# Patient Record
Sex: Male | Born: 1935 | Race: Black or African American | Hispanic: No | Marital: Married | Smoking: Former smoker
Health system: Southern US, Community
[De-identification: ages and names within clinical notes are randomized; demographics above are authoritative.]

## PROBLEM LIST (undated history)

## (undated) DIAGNOSIS — F32A Depression, unspecified: Secondary | ICD-10-CM

## (undated) DIAGNOSIS — N400 Enlarged prostate without lower urinary tract symptoms: Secondary | ICD-10-CM

## (undated) DIAGNOSIS — M1990 Unspecified osteoarthritis, unspecified site: Secondary | ICD-10-CM

## (undated) DIAGNOSIS — I209 Angina pectoris, unspecified: Secondary | ICD-10-CM

## (undated) DIAGNOSIS — K219 Gastro-esophageal reflux disease without esophagitis: Secondary | ICD-10-CM

## (undated) DIAGNOSIS — M109 Gout, unspecified: Secondary | ICD-10-CM

## (undated) DIAGNOSIS — I10 Essential (primary) hypertension: Secondary | ICD-10-CM

## (undated) DIAGNOSIS — I739 Peripheral vascular disease, unspecified: Secondary | ICD-10-CM

## (undated) DIAGNOSIS — F329 Major depressive disorder, single episode, unspecified: Secondary | ICD-10-CM

## (undated) DIAGNOSIS — I2510 Atherosclerotic heart disease of native coronary artery without angina pectoris: Secondary | ICD-10-CM

## (undated) DIAGNOSIS — J449 Chronic obstructive pulmonary disease, unspecified: Secondary | ICD-10-CM

## (undated) DIAGNOSIS — C801 Malignant (primary) neoplasm, unspecified: Secondary | ICD-10-CM

## (undated) DIAGNOSIS — E039 Hypothyroidism, unspecified: Secondary | ICD-10-CM

## (undated) HISTORY — PX: CARDIAC CATHETERIZATION: SHX172

## (undated) HISTORY — PX: OTHER SURGICAL HISTORY: SHX169

---

## 1959-05-06 HISTORY — PX: HERNIA REPAIR: SHX51

## 2002-04-03 ENCOUNTER — Ambulatory Visit (HOSPITAL_COMMUNITY): Admission: RE | Admit: 2002-04-03 | Discharge: 2002-04-03 | Payer: Self-pay | Admitting: Internal Medicine

## 2002-05-07 ENCOUNTER — Ambulatory Visit (HOSPITAL_COMMUNITY): Admission: RE | Admit: 2002-05-07 | Discharge: 2002-05-07 | Payer: Self-pay | Admitting: Internal Medicine

## 2003-04-09 ENCOUNTER — Encounter: Payer: Self-pay | Admitting: Rheumatology

## 2003-04-09 ENCOUNTER — Encounter (HOSPITAL_COMMUNITY): Admission: RE | Admit: 2003-04-09 | Discharge: 2003-05-09 | Payer: Self-pay | Admitting: Rheumatology

## 2003-04-24 ENCOUNTER — Encounter: Payer: Self-pay | Admitting: Rheumatology

## 2003-05-21 ENCOUNTER — Encounter (HOSPITAL_COMMUNITY): Admission: RE | Admit: 2003-05-21 | Discharge: 2003-06-20 | Payer: Self-pay | Admitting: Rheumatology

## 2005-05-27 ENCOUNTER — Inpatient Hospital Stay (HOSPITAL_COMMUNITY): Admission: AD | Admit: 2005-05-27 | Discharge: 2005-05-29 | Payer: Self-pay | Admitting: Cardiology

## 2005-05-29 ENCOUNTER — Ambulatory Visit: Payer: Self-pay | Admitting: Cardiology

## 2005-05-29 ENCOUNTER — Encounter: Payer: Self-pay | Admitting: Cardiology

## 2005-06-26 ENCOUNTER — Ambulatory Visit: Payer: Self-pay | Admitting: Cardiology

## 2006-09-06 ENCOUNTER — Ambulatory Visit (HOSPITAL_COMMUNITY): Admission: RE | Admit: 2006-09-06 | Discharge: 2006-09-06 | Payer: Self-pay | Admitting: Ophthalmology

## 2008-05-01 ENCOUNTER — Inpatient Hospital Stay (HOSPITAL_COMMUNITY): Admission: RE | Admit: 2008-05-01 | Discharge: 2008-05-03 | Payer: Self-pay | Admitting: Urology

## 2008-05-01 ENCOUNTER — Encounter (INDEPENDENT_AMBULATORY_CARE_PROVIDER_SITE_OTHER): Payer: Self-pay | Admitting: Urology

## 2010-09-24 ENCOUNTER — Encounter: Payer: Self-pay | Admitting: *Deleted

## 2010-12-16 DIAGNOSIS — R079 Chest pain, unspecified: Secondary | ICD-10-CM

## 2011-01-17 NOTE — Consult Note (Signed)
NAMETHANIEL, COLUCCIO               ACCOUNT NO.:  [id number]   MEDICAL RECORD NO.:  [id number]          PATIENT TYPE:  AMB   LOCATION:  DAY                           FACILITY:  [facility]   PHYSICIAN:  Ky Barban, M.D.DATE OF BIRTH:  12/18/1924   DATE OF CONSULTATION:  DATE OF DISCHARGE:                                 CONSULTATION   CHIEF COMPLAINT:  Urinary retention.   HISTORY:  Mr. Deloria Lair is an 77 year old gentleman who has a  longstanding history of prostatism.  Recently, he was in [facility] with urinary tract infection, and he was in urinary retention  and found to have BPH with bladder neck obstruction, so I advised him to  undergo TUR prostate for which he is coming as outpatient in the morning  and is undergoing TUR prostate.   PAST HISTORY:  Otherwise unremarkable.   PHYSICAL EXAMINATION:  GENERAL:  Well-nourished, well-developed male not  in acute distress, fully conscious, alert, oriented.  CENTRAL NERVOUS SYSTEM:  Negative.  HEAD, NECK, EYES, ENT:  Negative.  CHEST:  Symmetrical.  HEART:  Regular sinus rhythm with no murmur.  ABDOMEN:  Soft, flat.  Liver, spleen, and kidneys are not palpable.  BACK:  No CVA tenderness.  PELVIC:  External genitalia is uncircumcised.  Meatus adequate.  Testicles are normal.  RECTAL:  Sphincter tone is normal.  No rectal mass.  Prostate 2+,  smooth, and firm.   IMPRESSION:  Benign prostatic hyperplasia with bladder neck obstruction.   PLAN:  Transurethral resection of prostate under anesthesia, then admit  him in the hospital.  I have discussed the procedure in detail with the  son, and the son want me to go ahead and proceed.      Ky Barban, M.D.  Electronically Signed     MIJ/MEDQ  D:  04/30/2008  T:  05/01/2008  Job:  [id number]

## 2011-01-17 NOTE — Op Note (Signed)
John Schmidt, John Schmidt               ACCOUNT NO.:  [id number]   MEDICAL RECORD NO.:  [id number]          PATIENT TYPE:  AMB   LOCATION:  DAY                           FACILITY:  [facility]   PHYSICIAN:  Ky Barban, M.D.DATE OF BIRTH:  12/18/1924   DATE OF PROCEDURE:  DATE OF DISCHARGE:                               OPERATIVE REPORT   PREOPERATIVE DIAGNOSES:  Acute urinary retention, benign prostatic  hypertrophy.   POSTOPERATIVE DIAGNOSES:  Acute urinary retention, benign prostatic  hypertrophy.   PROCEDURE:  Transurethral resection of prostate.   ANESTHESIA:  Spinal.   PROCEDURE:  The patient under spinal anesthesia in lithotomy position,  after usual prep and drape, #28 Iglesias resectoscope was introduced  into the bladder.  The prostatic urethra was inspected, procedure to the  resection of the prostate.  The bladder neck was circumferentially  dissected down to the circular fibers.  Now, the resectoscope was pulled  back at the level of the verumontanum, rotated to 11 o'clock position.  The right lobe was resected between 11 and 7 o'clock position.  Similarly, the left lobe was resected between 1 and 5 o'clock position.  Most of the adenoma is out.  There is some tissue in the anterior and  the posterior midline, which was resected very carefully not to injure  the verumontanum or the sphincter.  Prostatic urethra was open.  Chips  were evacuated.  Bleeders were coagulated.  At this point, resectoscope  was removed and #22 three-way Foley catheter left in for drainage.  CBI  started.  CVA is clear.  The patient left the operating room in  satisfactory condition.      Ky Barban, M.D.  Electronically Signed     MIJ/MEDQ  D:  05/01/2008  T:  05/02/2008  Job:  [id number]

## 2011-01-20 NOTE — Consult Note (Signed)
NAMEJEREN, John Schmidt NO.:  [id number]   MEDICAL RECORD NO.:  [id number]                   PATIENT TYPE:   LOCATION:                                       FACILITY:   PHYSICIAN:  Aundra Dubin, M.D.            DATE OF BIRTH:   DATE OF CONSULTATION:  05/21/2003  DATE OF DISCHARGE:                                   CONSULTATION   CHIEF COMPLAINT:  Polyarthritis.   HISTORY OF PRESENT ILLNESS:  John Schmidt labs returned showing a high  rheumatoid factor of 2360.  Other labs showed a hemoglobin of 8.1, MCV 70,  platelets 441.  I tried to add on iron studies, but this was not possible.  His ESR was 94.  Sodium 133, glucose 100, calcium 8.4, albumin 3.0,  creatinine 0.9.  He had a chest x-ray which showed a 1.2-cm nodule to the  left lobe.  A CT scan was obtained, and it was felt to be scarring.  However, the radiologist recommends a three-month followup concerning this.  The x-rays which I have reviewed show significant erosive arthritis to the  MCPs, and most of the joints are involved.  His thoracic x-ray was read as  having degenerative changes which he does, but I suspect that he has some  moderate compression fractures at the approximate T5 and T6 vertebrae.  Mr.  Schmidt tells me that his back is better.   Overall, John Schmidt is significantly better.  He said that he was a whole  lot better.  As the prednisone had been reduced, he is still aching in his  hands, wrists, knees, and feet.  There has been a 2 pound weight gain.  He  is a thin man to begin with.  There has been no polyuria, polydipsia,  rashes, visual changes, headaches, diarrhea, numbness, tingling, jaw  claudication.   PAST MEDICAL HISTORY:  Past medical history is reviewed along with the  family history, social history, and drug intolerances which are all  unchanged.   MEDICATIONS:  1. Prednisone 10 mg daily.  2. Darvocet p.r.n.  3. Hydrocodone p.r.n.  4. Off  Bextra.   PHYSICAL EXAMINATION:  VITAL SIGNS:  Weight 136 pounds, blood pressure  150/80, respirations 16.  GENERAL:  He is a thin man who appears chronically ill.  SKIN:  Dry but otherwise clear.  LUNGS:  Quite sounds but clear.  HEART:  1/6 SEM at the LSB.  ABDOMEN:  Negative HSM, nontender.  MUSCULOSKELETAL:  The hands show a very significant amount of contracture to  the PIPs.  The MCPs and wrists have a great deal of chronic synovitis which  I believe is less tender.  The elbows have flexion contractures.  The  shoulders move with stiffness and are decreased.  The back was less tender.  The knees were warm and still quite hypertrophic.  The ankles and feet had  moderate tenderness.  ASSESSMENT AND PLAN:  1. Erosive rheumatoid arthritis.  With review of the x-rays along with     positive rheumatoid factor and his exam, this is clearly rheumatoid     arthritis and not an unusual presentation of gout.  His uric acid ws     normal.  I am starting him on methotrexate 12.5 mg every week and folic     acid 1 mg daily.  We are giving him an injection of 120 mg of Depo-Medrol     today, and he will continue on prednisone 10 mg daily.  I have asked his     niece who helps him with his medications to give him calcium 600 mg with     vitamin D b.i.d. with food.   I have discussed with John Schmidt and his niece that there are chances of  side effects with methotrexate.  He is not drinking alcohol and will be at  low risk of any serious liver problems such as cirrhosis.  We will follow  liver enzymes and a complete blood count on a regular basis.  There is also  a chance of usual and unusual infections.  There is also a chance of a  pulmonary reaction with cough, fever, and shortness of breath.  There is  also a chance of nausea, stomatitis, headache, rashes, and malaise.  As I  have discussed with both of them that methotrexate has a good balance of  being cost effective, efficacious,  and safe.  1. Anemia.  We will check a complete blood count and iron studies today.  2. Pulmonary nodule.  I have scheduled him for a early to mid-December     computed tomography scan followup on this.  As I have discussed with Mr.     Schmidt and his niece, I will not be at [facility] at that time.  I am very     hopeful that this does not somehow fall through the cracks.   He will return to [facility] in five weeks for followup.                                               Aundra Dubin, M.D.    WWT/MEDQ  D:  05/21/2003  T:  05/21/2003  Job:  [id number]   cc:   Teola Bradley, M.D.  [phone] [address]  [place] [address]  Fax: [phone]   [address]  [place]  [place] [address]  Fax: [phone]

## 2011-01-20 NOTE — Discharge Summary (Signed)
John Schmidt, John Schmidt               ACCOUNT NO.:  [id number]   MEDICAL RECORD NO.:  [id number]          PATIENT TYPE:  INP   LOCATION:  2011                         FACILITY:  [facility]   PHYSICIAN:  Learta Codding, M.D. LHCDATE OF BIRTH:  12/18/1924   DATE OF ADMISSION:  05/27/2005  DATE OF DISCHARGE:  05/29/2005                                 DISCHARGE SUMMARY   PROCEDURE:  1.  Adenosine Myoview.  2.  2D echocardiogram.   DISCHARGE DIAGNOSIS:  1.  Chest pain, minimal troponin elevations, peak 0.13 with negative CK MBs      and Myoview negative for ischemia/scar.  2.  Unknown lipid status, status post lipid profile this admission with      total cholesterol 187, triglycerides 72, HDL 76, LDL 97.  3.  Anemia with a hemoglobin of 8.1 in August 2004 and 11.9 this admission,      iron deficient in 2004.  4.  Rheumatoid arthritis.  5.  Hypertension.  6.  History of constipation.   HOSPITAL COURSE:  [name] is a 77 year old male with no known history of  coronary artery disease.  He has significant rheumatoid arthritis but has  recently not been taking his Methotrexate.  He has been getting around  pretty well, he says, and not having any significant difficulties.  He had  onset of chest tightness on the day of admission and went to [facility].  His symptoms were concerning for anginal pain and he was  transferred to [facility] for further evaluation.  His troponin Is  were slightly elevated but stable between 0.08 and 0.13.  His CK MBs were  all negative.  He was evaluated and felt stable for Myoview for further  evaluation.  The Myoview showed no ischemia and an EF of 51%.  A 2D  echocardiogram showed possible diastolic dysfunction with mildly decreased  left ventricular systolic function, EF 40-50%.  There was diffuse left  ventricular hypokinesis and no localized wall motion abnormalities.  The  left atrium was mildly dilated.  There were no significant  valvular  abnormalities and no effusion.  [name]'s symptoms had resolved.  A  chest x-ray was performed and showed moderate hyperinflation and chronic  parenchymal changes but no significant abnormality.  He is tentatively  considered stable for discharge on May 29, 2005, with outpatient  follow up arranged.  Of note, because of the systolic blood pressure in the  170s, Norvasc 5 mg was added to his medication regimen.  Prior to admission,  he was prescribed Lasix 20 mg a day for hypertension and lower extremity  edema but he had not been taking it regularly.   DISCHARGE INSTRUCTIONS:  His activity level is to be as tolerated.  He is to  increase his activity slowly.  He is to stick to a low fat and salt diet.  He is to follow up with Arnette Felts, P.A.-C. for Dr. Andee Lineman on October 10 at  2 p.m. and follow up with Dr. Charm Barges, as well.   DISCHARGE MEDICATIONS:  1.  Methotrexate 2.5 mg 3  tabs b.i.d. every Monday, the patient has not had      since may, restart per Dr. Charm Barges.  2.  Prednisone 10 mg daily.  3.  Omeprazole 20 mg b.i.d.  4.  Vicodin p.r.n.  5.  Furosemide 20 mg daily.  6.  Norvasc 20 mg daily.      Theodore Demark, P.A. [facility]      Learta Codding, M.D. [place]  Electronically Signed    RB/MEDQ  D:  05/29/2005  T:  05/30/2005  Job:  [id number]   cc:   Samuel Jester  Fax: [phone]

## 2011-01-20 NOTE — H&P (Signed)
John Schmidt, John Schmidt               ACCOUNT NO.:  [id number]   MEDICAL RECORD NO.:  [id number]          PATIENT TYPE:  INP   LOCATION:  2902                         FACILITY:  [facility]   PHYSICIAN:  [place] Bing, M.D. [facility] OF BIRTH:  12/18/1924   DATE OF ADMISSION:  05/27/2005  DATE OF DISCHARGE:                                HISTORY & PHYSICAL   REFERRING:  Dr. Blinda Leatherwood.   PRIMARY CARE PHYSICIAN:  Dr. Stoney Bang.   HISTORY OF PRESENT ILLNESS:  A 77 year old gentleman without prior cardiac  history presents with chest pain. Mr. Fritze has never previously been seen  by cardiologist. He has generally enjoyed excellent health except for the  recent development of arthritis, which has been characterized as rheumatoid  arthritis. He was in his usual state of health until this morning when he  developed sudden onset of sharp mid substernal moderately severe chest  discomfort radiating to the back. There was no associated nausea,  diaphoresis nor dyspnea. The pain waxed and waned over the course of the  morning prompting him to come to the emergency department. Initial cardiac  markers were negative. A subsequent set showed a minimal elevation of  troponin prompting transfer to [facility].   Mr. Espindola has no history of hypertension nor diabetes. He is unaware of  his lipid status. He has smoked cigars, but he does not inhale.   PAST MEDICAL HISTORY:  Notable for rheumatoid arthritis, which has been  treated with injections and oral medication by Dr. Hilda Lias. The patient  takes a second oral medication for an unknown reason. He does not know the  name of either of his medications. He has no known medical allergies. He has  what sounds like prostate problems in the past, but probably did not undergo  TURP. He has not been told of anemia in the past. He has been told he needs  cataract surgery, but this has not been arranged. His has only prior surgery  was a hernia repair 20  years ago.   FAMILY HISTORY:  Negative for coronary disease.   REVIEW OF SYSTEMS:  Notable for constipation that has been controlled  medically; he has occasional headaches. He occasionally notes black stools.  He has not had dyspnea, cough or sputum production. All other systems  reviewed and are negative.   SOCIAL HISTORY:  Lives with his wife, who is in relatively good health. No  history of excessive alcohol use. Does not drive, but is active around the  house without difficulty.   REVIEW OF SYSTEMS:  All other systems reviewed and are negative. Recent  weight loss.   PHYSICAL EXAMINATION:  GENERAL:  Pleasant, thin gentleman in no acute  distress.  VITAL SIGNS:  Heart rate 60 and regular, O2 saturation 100% on 2 liters.  Blood pressure 180/100.  NECK: No jugular venous distension; normal carotid upstrokes without bruits.  HEENT: Exophthalmos of the right eye; arcus senilis; normal lids and  conjunctiva; anicteric sclerae.  LUNGS: Clear. Resonant to percussion.  CARDIAC: Normal PMI; normal first heart sounds; increased aortic component  second heart sounds; minimal  early diastolic murmur at the cardiac base;  minimal apical systolic murmur; fourth heart sound present.  ABDOMEN: Soft and nontender; no organomegaly; no masses; normal bowel sounds  without bruits.  EXTREMITIES: No edema; distal pulses intact. Scar over the right forearm.  SKIN: No significant lesions; multiple healed circular areas of deep  pigmentation.  NEUROMUSCULAR: Symmetric strength and tone; normal cranial nerves.  PSYCHIATRIC: Normal affect; alert and oriented.  MUSCULOSKELETAL:  Contracture of fingers that are moderately severe; mild  contracture of the elbows; synovial thickening around these.   EKG: Sinus bradycardia; prominent voltage; otherwise normal.   Laboratory notable for hemoglobin of 11 and hematocrit of 32.5 with normal  MCV. Second troponin was 0.12. BNP level was for 442. Creatinine was  1.3.   A CT scan of the chest done at [facility] not available.   IMPRESSION:  Mr. Corado presents with chest discomfort, which improved  after initial treatment in the emergency department. EKG was normal,  presumably during symptoms. Troponin has increased to the borderline range.  Pulmonary embolism has presumably been ruled out with capsule CT scanning.   We will obtain serial cardiac markers and EKG's to exclude myocardial  injury. An echocardiogram will be obtained due to his history of chest pain  and history of murmurs. At this point, it is unclear whether his symptoms  relate to cardiac problems or what an additional cardiac testing will be  needed either in-hospital or subsequent to discharge. These decisions will  be based upon his initial testing and whether or not symptoms recur. Initial  treatment will be with intravenous heparin. Beta blockers will be withheld  due to bradycardia. Sublingual nitroglycerin will be used as needed.   He is hypertensive at the present time. Initial therapy will be with  amlodipine.   He has significant arthritis. He might benefit from consultation with a  rheumatologist. This will be arranged if possible.      [place] Bing, M.D. [facility]  Electronically Signed     RR/MEDQ  D:  05/27/2005  T:  05/27/2005  Job:  [id number]

## 2011-01-20 NOTE — Op Note (Signed)
[name], John Schmidt                           ACCOUNT NO.:  [id number]   MEDICAL RECORD NO.:  [id number]                   PATIENT TYPE:  AMB   LOCATION:  DAY                                  FACILITY:  [facility]   PHYSICIAN:  Lionel December, M.D.                 DATE OF BIRTH:  12/18/1924   DATE OF PROCEDURE:  DATE OF DISCHARGE:                                 OPERATIVE REPORT   PROCEDURE:  Esophagogastroduodenoscopy.   ENDOSCOPIST:  Lionel December, M.D.   INDICATION:  The patient is a 77 year old African American male with  epigastric pain.  His symptoms are suspicious of peptic ulcer disease.  He  has debilitating RA and has been on Bextra, which was discontinued at the  time of office visit last week.  He had upper GI/small-bowel follow-through  at [facility] but there was no evidence of peptic ulcer disease, but showed  thickened loop of jejunum.  He is undergoing diagnostic  esophagogastroduodenoscopy.  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  Procedure was reviewed with the patient, informed consent was  obtained.   PREOPERATIVE MEDICATIONS:  Cetacaine spray for oropharyngeal topical  anesthesia, Demerol 25 mg IV, Versed 1 mg IV.   INSTRUMENT:  Olympus video system.   FINDINGS:  Procedure performed in endoscopy suite.  The patient's vital  signs and O2 saturations were monitored during the procedure and remained  stable.  Patient was placed in left lateral decubitus position and endoscope  was passed via oropharynx without any difficulty into esophagus.   Esophagus:  Mucosa of the esophagus was normal throughout.  Squamocolumnar  junction was unremarkable.  There was no ring stricture or hiatal hernia.   Stomach was empty and distended very well with insufflation.  Folds in the  proximal stomach were normal.  Examination of mucosa at gastric body,  antrum, pyloric channel as well as  angularis and fundus was normal.   Duodenum:  Examination of the bulb, second and third part of the duodenum  was normal.  Endoscope was withdrawn.  Laryngeal inlet was also well-seen on  the way in and there was minimal movement to right vocal cord.   Patient tolerated the procedure well.   FINAL DIAGNOSES:  1. Normal esophagogastroduodenoscopy.  2.     Right cord paralysis which may indicate a prior cerebrovascular accident,     etc.  3. No lesion noted to explained the epigastric pain.   RECOMMENDATION:  He will continue his PPI but resume Bextra and we will  bring him back for abdominal CT.  Lionel December, M.D.    NR/MEDQ  D:  04/03/2002  T:  04/09/2002  Job:  [id number]   cc:   Samuel Jester

## 2011-01-20 NOTE — Discharge Summary (Signed)
NAME:  John Schmidt, John Schmidt               ACCOUNT NO.:  [id number]   MEDICAL RECORD NO.:  [id number]          PATIENT TYPE:  AMB   LOCATION:  DAY                           FACILITY:  [facility]   PHYSICIAN:  Ky Barban, M.D.DATE OF BIRTH:  12/18/1924   DATE OF ADMISSION:  DATE OF DISCHARGE:  LH                               DISCHARGE SUMMARY   An 77 years old gentleman who was in urinary retention was recently in  [facility] with UTI and found to be in urinary retention.  He was  sent home with Foley catheter.  He has long-standing history of  prostatism, workup in the office shows enlarged prostate with bladder  neck obstruction.  I have advised him to undergo TUR prostate.  A  routine preadmission workup was done.  CBC showed WBC count 6.9,  hematocrit is 32.81.  His sodium 139, potassium 4.4, chloride 104, CO2  is 27, glucose 79, BUN is 17, creatinine 1.1.  Urinalysis shows he has a  Foley catheter, nitrite positive.  He was taken to the operating room  where TUR prostate was done.  Postop course was benign.  On first postop  day, his urine is clear, so CVA was discontinued.  He was placed out of  bed and started on regular diet.  On second postop day, urine was clear.  Foley catheter was taken out.  He is voiding fine with good stream, good  control.  He is being discharged home, will be followed up by me in the  office.   FINAL PATHOLOGY:  No evidence of any malignancy.   FINAL DISCHARGE DIAGNOSIS:  Benign prostatic hypertrophy.   DISCHARGE CONDITION:  Improved.   He was advised to continue his usual medications, and I will see him  back in the office in 2 weeks.      Ky Barban, M.D.  Electronically Signed     MIJ/MEDQ  D:  06/23/2008  T:  06/24/2008  Job:  [id number]

## 2012-04-17 ENCOUNTER — Other Ambulatory Visit (HOSPITAL_COMMUNITY): Payer: Self-pay | Admitting: Urology

## 2012-04-17 DIAGNOSIS — R109 Unspecified abdominal pain: Secondary | ICD-10-CM

## 2012-04-17 DIAGNOSIS — N400 Enlarged prostate without lower urinary tract symptoms: Secondary | ICD-10-CM

## 2012-04-26 ENCOUNTER — Ambulatory Visit (HOSPITAL_COMMUNITY): Payer: PRIVATE HEALTH INSURANCE

## 2012-05-07 ENCOUNTER — Ambulatory Visit (HOSPITAL_COMMUNITY)
Admission: RE | Admit: 2012-05-07 | Discharge: 2012-05-07 | Disposition: A | Discharge status: Home / Self Care | Payer: PRIVATE HEALTH INSURANCE | Source: Ambulatory Visit | Attending: Urology | Admitting: Urology

## 2012-05-07 DIAGNOSIS — N400 Enlarged prostate without lower urinary tract symptoms: Secondary | ICD-10-CM | POA: Insufficient documentation

## 2012-05-07 DIAGNOSIS — R109 Unspecified abdominal pain: Secondary | ICD-10-CM | POA: Insufficient documentation

## 2013-04-22 NOTE — Patient Instructions (Signed)
Your procedure is scheduled on: 04/28/2013  Report to [facility] at  730   AM.  Call this number if you have problems the morning of surgery: [phone]   Do not eat food or drink liquids :After Midnight.      Take these medicines the morning of surgery with A SIP OF WATER: none   Do not wear jewelry, make-up or nail polish.  Do not wear lotions, powders, or perfumes.   Do not shave 48 hours prior to surgery.  Do not bring valuables to the hospital.  Contacts, dentures or bridgework may not be worn into surgery.  Leave suitcase in the car. After surgery it may be brought to your room.  For patients admitted to the hospital, checkout time is 11:00 AM the day of discharge.   Patients discharged the day of surgery will not be allowed to drive home.  :     Please read over the following fact sheets that you were given: Coughing and Deep Breathing, Surgical Site Infection Prevention, Anesthesia Post-op Instructions and Care and Recovery After Surgery    Cataract A cataract is a clouding of the lens of the eye. When a lens becomes cloudy, vision is reduced based on the degree and nature of the clouding. Many cataracts reduce vision to some degree. Some cataracts make people more near-sighted as they develop. Other cataracts increase glare. Cataracts that are ignored and become worse can sometimes look white. The white color can be seen through the pupil. CAUSES   Aging. However, cataracts may occur at any age, even in newborns.   Certain drugs.   Trauma to the eye.   Certain diseases such as diabetes.   Specific eye diseases such as chronic inflammation inside the eye or a sudden attack of a rare form of glaucoma.   Inherited or acquired medical problems.  SYMPTOMS   Gradual, progressive drop in vision in the affected eye.   Severe, rapid visual loss. This most often happens when trauma is the cause.  DIAGNOSIS  To detect a cataract, an eye doctor examines the lens. Cataracts are  best diagnosed with an exam of the eyes with the pupils enlarged (dilated) by drops.  TREATMENT  For an early cataract, vision may improve by using different eyeglasses or stronger lighting. If that does not help your vision, surgery is the only effective treatment. A cataract needs to be surgically removed when vision loss interferes with your everyday activities, such as driving, reading, or watching TV. A cataract may also have to be removed if it prevents examination or treatment of another eye problem. Surgery removes the cloudy lens and usually replaces it with a substitute lens (intraocular lens, IOL).  At a time when both you and your doctor agree, the cataract will be surgically removed. If you have cataracts in both eyes, only one is usually removed at a time. This allows the operated eye to heal and be out of danger from any possible problems after surgery (such as infection or poor wound healing). In rare cases, a cataract may be doing damage to your eye. In these cases, your caregiver may advise surgical removal right away. The vast majority of people who have cataract surgery have better vision afterward. HOME CARE INSTRUCTIONS  If you are not planning surgery, you may be asked to do the following:  Use different eyeglasses.   Use stronger or brighter lighting.   Ask your eye doctor about reducing your medicine dose or changing medicines  if it is thought that a medicine caused your cataract. Changing medicines does not make the cataract go away on its own.   Become familiar with your surroundings. Poor vision can lead to injury. Avoid bumping into things on the affected side. You are at a higher risk for tripping or falling.   Exercise extreme care when driving or operating machinery.   Wear sunglasses if you are sensitive to bright light or experiencing problems with glare.  SEEK IMMEDIATE MEDICAL CARE IF:   You have a worsening or sudden vision loss.   You notice redness,  swelling, or increasing pain in the eye.   You have a fever.  Document Released: 08/21/2005 Document Revised: 08/10/2011 Document Reviewed: 04/14/2011 [facility] Patient Information 2012 [place].PATIENT INSTRUCTIONS POST-ANESTHESIA  IMMEDIATELY FOLLOWING SURGERY:  Do not drive or operate machinery for the first twenty four hours after surgery.  Do not make any important decisions for twenty four hours after surgery or while taking narcotic pain medications or sedatives.  If you develop intractable nausea and vomiting or a severe headache please notify your doctor immediately.  FOLLOW-UP:  Please make an appointment with your surgeon as instructed. You do not need to follow up with anesthesia unless specifically instructed to do so.  WOUND CARE INSTRUCTIONS (if applicable):  Keep a dry clean dressing on the anesthesia/puncture wound site if there is drainage.  Once the wound has quit draining you may leave it open to air.  Generally you should leave the bandage intact for twenty four hours unless there is drainage.  If the epidural site drains for more than 36-48 hours please call the anesthesia department.  QUESTIONS?:  Please feel free to call your physician or the hospital operator if you have any questions, and they will be happy to assist you.

## 2013-04-23 ENCOUNTER — Encounter (HOSPITAL_COMMUNITY)
Admission: RE | Admit: 2013-04-23 | Discharge: 2013-04-23 | Disposition: A | Discharge status: Home / Self Care | Payer: PRIVATE HEALTH INSURANCE | Source: Ambulatory Visit | Attending: Ophthalmology | Admitting: Ophthalmology

## 2013-04-23 ENCOUNTER — Encounter (HOSPITAL_COMMUNITY): Payer: Self-pay

## 2013-04-23 ENCOUNTER — Other Ambulatory Visit: Payer: Self-pay

## 2013-04-23 DIAGNOSIS — Z01810 Encounter for preprocedural cardiovascular examination: Secondary | ICD-10-CM | POA: Insufficient documentation

## 2013-04-23 DIAGNOSIS — Z01812 Encounter for preprocedural laboratory examination: Secondary | ICD-10-CM | POA: Insufficient documentation

## 2013-04-23 HISTORY — DX: Atherosclerotic heart disease of native coronary artery without angina pectoris: I25.10

## 2013-04-23 HISTORY — DX: Gout, unspecified: M10.9

## 2013-04-23 HISTORY — DX: Peripheral vascular disease, unspecified: I73.9

## 2013-04-23 HISTORY — DX: Gastro-esophageal reflux disease without esophagitis: K21.9

## 2013-04-23 HISTORY — DX: Unspecified osteoarthritis, unspecified site: M19.90

## 2013-04-23 HISTORY — DX: Essential (primary) hypertension: I10

## 2013-04-23 HISTORY — DX: Chronic obstructive pulmonary disease, unspecified: J44.9

## 2013-04-23 LAB — BASIC METABOLIC PANEL
BUN: 33 mg/dL — ABNORMAL HIGH (ref 6–23)
Calcium: 9.3 mg/dL (ref 8.4–10.5)
Creatinine, Ser: 1.4 mg/dL — ABNORMAL HIGH (ref 0.50–1.35)
GFR calc Af Amer: 51 mL/min — ABNORMAL LOW (ref >90)
GFR calc non Af Amer: 44 mL/min — ABNORMAL LOW (ref >90)
Glucose, Bld: 104 mg/dL — ABNORMAL HIGH (ref 70–99)
Potassium: 4.3 mEq/L (ref 3.5–5.1)

## 2013-04-23 LAB — HEMOGLOBIN AND HEMATOCRIT, BLOOD: Hemoglobin: 9.8 g/dL — ABNORMAL LOW (ref 13.0–17.0)

## 2013-04-23 NOTE — Progress Notes (Signed)
Spoke to nurse at  Dr. Francee Nodal office (per patient's permission) in [place], [place] to retrieve patient's medical history.

## 2013-04-24 NOTE — Pre-Procedure Instructions (Signed)
Dr Gonzalez aware of labs. No orders given. 

## 2013-04-25 MED ORDER — CYCLOPENTOLATE-PHENYLEPHRINE 0.2-1 % OP SOLN
OPHTHALMIC | Status: AC
  Filled 2013-04-25: qty 2

## 2013-04-25 MED ORDER — TETRACAINE HCL 0.5 % OP SOLN
OPHTHALMIC | Status: AC
  Filled 2013-04-25: qty 2

## 2013-04-25 MED ORDER — LIDOCAINE HCL (PF) 1 % IJ SOLN
INTRAMUSCULAR | Status: AC
  Filled 2013-04-25: qty 2

## 2013-04-25 MED ORDER — TETRACAINE HCL 0.5 % OP SOLN: 1.0000 [drp] | OPHTHALMIC | Status: DC

## 2013-04-25 MED ORDER — LIDOCAINE HCL 3.5 % OP GEL: 1.0000 "application " | Freq: Once | OPHTHALMIC | Status: DC

## 2013-04-25 MED ORDER — CYCLOPENTOLATE-PHENYLEPHRINE 0.2-1 % OP SOLN: 1.0000 [drp] | OPHTHALMIC | Status: DC

## 2013-04-25 MED ORDER — NEOMYCIN-POLYMYXIN-DEXAMETH 3.5-10000-0.1 OP OINT
TOPICAL_OINTMENT | OPHTHALMIC | Status: AC
  Filled 2013-04-25: qty 3.5

## 2013-04-25 MED ORDER — LIDOCAINE HCL 3.5 % OP GEL
OPHTHALMIC | Status: AC
  Filled 2013-04-25: qty 5

## 2013-04-25 MED ORDER — PHENYLEPHRINE HCL 2.5 % OP SOLN: 1.0000 [drp] | OPHTHALMIC | Status: DC

## 2013-04-28 ENCOUNTER — Encounter (HOSPITAL_COMMUNITY): Payer: Self-pay | Admitting: *Deleted

## 2013-04-28 ENCOUNTER — Ambulatory Visit (HOSPITAL_COMMUNITY): Payer: PRIVATE HEALTH INSURANCE | Admitting: Anesthesiology

## 2013-04-28 ENCOUNTER — Ambulatory Visit (HOSPITAL_COMMUNITY)
Admission: RE | Admit: 2013-04-28 | Discharge: 2013-04-28 | Disposition: A | Discharge status: Home / Self Care | Payer: PRIVATE HEALTH INSURANCE | Source: Ambulatory Visit | Attending: Ophthalmology | Admitting: Ophthalmology

## 2013-04-28 ENCOUNTER — Encounter (HOSPITAL_COMMUNITY): Payer: Self-pay | Admitting: Anesthesiology

## 2013-04-28 ENCOUNTER — Encounter (HOSPITAL_COMMUNITY)
Admission: RE | Disposition: A | Discharge status: Home / Self Care | Payer: Self-pay | Source: Ambulatory Visit | Attending: Ophthalmology

## 2013-04-28 DIAGNOSIS — J449 Chronic obstructive pulmonary disease, unspecified: Secondary | ICD-10-CM | POA: Insufficient documentation

## 2013-04-28 DIAGNOSIS — H2510 Age-related nuclear cataract, unspecified eye: Secondary | ICD-10-CM | POA: Insufficient documentation

## 2013-04-28 DIAGNOSIS — I10 Essential (primary) hypertension: Secondary | ICD-10-CM | POA: Insufficient documentation

## 2013-04-28 DIAGNOSIS — J4489 Other specified chronic obstructive pulmonary disease: Secondary | ICD-10-CM | POA: Insufficient documentation

## 2013-04-28 HISTORY — PX: CATARACT EXTRACTION W/PHACO: SHX586

## 2013-04-28 SURGERY — PHACOEMULSIFICATION, CATARACT, WITH IOL INSERTION
Anesthesia: Monitor Anesthesia Care | Site: Eye | Laterality: Left | Wound class: Clean

## 2013-04-28 MED ORDER — LIDOCAINE HCL (PF) 1 % IJ SOLN
INTRAOCULAR | Status: DC | PRN
  Administered 2013-04-28: 09:00:00 via OPHTHALMIC

## 2013-04-28 MED ORDER — LACTATED RINGERS IV SOLN
INTRAVENOUS | Status: DC
  Administered 2013-04-28: 1000 mL via INTRAVENOUS

## 2013-04-28 MED ORDER — PHENYLEPHRINE HCL 2.5 % OP SOLN
1.0000 [drp] | OPHTHALMIC | Status: AC
  Administered 2013-04-28 (×3): 1 [drp] via OPHTHALMIC

## 2013-04-28 MED ORDER — LACTATED RINGERS IV SOLN
INTRAVENOUS | Status: DC | PRN
  Administered 2013-04-28: 08:00:00 via INTRAVENOUS

## 2013-04-28 MED ORDER — MIDAZOLAM HCL 2 MG/2ML IJ SOLN
INTRAMUSCULAR | Status: AC
  Filled 2013-04-28: qty 2

## 2013-04-28 MED ORDER — CYCLOPENTOLATE-PHENYLEPHRINE 0.2-1 % OP SOLN
1.0000 [drp] | OPHTHALMIC | Status: AC
  Administered 2013-04-28 (×3): 1 [drp] via OPHTHALMIC

## 2013-04-28 MED ORDER — EPINEPHRINE HCL 1 MG/ML IJ SOLN
INTRAOCULAR | Status: DC | PRN
  Administered 2013-04-28: 09:00:00

## 2013-04-28 MED ORDER — EPINEPHRINE HCL 1 MG/ML IJ SOLN
INTRAMUSCULAR | Status: AC
  Filled 2013-04-28: qty 1

## 2013-04-28 MED ORDER — TETRACAINE HCL 0.5 % OP SOLN
1.0000 [drp] | OPHTHALMIC | Status: AC
  Administered 2013-04-28 (×3): 1 [drp] via OPHTHALMIC

## 2013-04-28 MED ORDER — PROVISC 10 MG/ML IO SOLN
INTRAOCULAR | Status: DC | PRN
  Administered 2013-04-28: 8.5 mg via INTRAOCULAR

## 2013-04-28 MED ORDER — NEOMYCIN-POLYMYXIN-DEXAMETH 0.1 % OP OINT
TOPICAL_OINTMENT | OPHTHALMIC | Status: DC | PRN
  Administered 2013-04-28: 1 via OPHTHALMIC

## 2013-04-28 MED ORDER — PHENYLEPHRINE HCL 2.5 % OP SOLN
OPHTHALMIC | Status: AC
  Filled 2013-04-28: qty 2

## 2013-04-28 MED ORDER — BSS IO SOLN
INTRAOCULAR | Status: DC | PRN
  Administered 2013-04-28: 15 mL via INTRAOCULAR

## 2013-04-28 MED ORDER — LIDOCAINE HCL 3.5 % OP GEL
1.0000 "application " | Freq: Once | OPHTHALMIC | Status: AC
  Administered 2013-04-28: 1 via OPHTHALMIC

## 2013-04-28 MED ORDER — POVIDONE-IODINE 5 % OP SOLN
OPHTHALMIC | Status: DC | PRN
  Administered 2013-04-28: 1 via OPHTHALMIC

## 2013-04-28 MED ORDER — MIDAZOLAM HCL 2 MG/2ML IJ SOLN
1.0000 mg | INTRAMUSCULAR | Status: DC | PRN
  Administered 2013-04-28: 2 mg via INTRAVENOUS

## 2013-04-28 SURGICAL SUPPLY — 32 items

## 2013-04-28 NOTE — Op Note (Signed)
Date of Admission: 04/28/2013  Date of Surgery: 04/28/2013  Pre-Op Dx: Cataract  Left  Eye  Post-Op Dx: Cataract  Left  Eye,  Dx Code 366.16  Surgeon: Gemma Payor, M.D.  Assistants: None  Anesthesia: Topical with MAC  Indications: Painless, progressive loss of vision with compromise of daily activities.  Surgery: Cataract Extraction with Intraocular lens Implant Left Eye  Discription: The patient had dilating drops and viscous lidocaine placed into the left eye in the pre-op holding area. After transfer to the operating room, a time out was performed. The patient was then prepped and draped. Beginning with a 75 degree blade a paracentesis port was made at the surgeon's 2 o'clock position. The anterior chamber was then filled with 1% non-preserved lidocaine. This was followed by filling the anterior chamber with Provisc. A bent cystatome needle was used to create a continuous tear capsulotomy. Hydrodissection was performed with balanced salt solution on a Fine canula. The lens nucleus was then removed using the phacoemulsification handpiece. Residual cortex was removed with the I&A handpiece. The anterior chamber and capsular bag were refilled with Provisc. A posterior chamber intraocular lens was placed into the capsular bag with it's injector. The implant was positioned with the Kuglan hook. The Provisc was then removed from the anterior chamber and capsular bag with the I&A handpiece. Stromal hydration of the main incision and paracentesis port was performed with BSS on a Fine canula. The wounds were tested for leak which was negative. The patient tolerated the procedure well. There were no operative complications. The patient was then transferred to the recovery room in stable condition.  Complications: None  Specimen: None  EBL: None  Prosthetic device: B&L enVista, MX60, power 21.0D, SN [id number].

## 2013-04-28 NOTE — Transfer of Care (Signed)
Immediate Anesthesia Transfer of Care Note  Patient: John Schmidt  Procedure(s) Performed: Procedure(s) with comments: CATARACT EXTRACTION PHACO AND INTRAOCULAR LENS PLACEMENT (IOC) (Left) - CDE:21.0  Patient Location: Short Stay  Anesthesia Type:MAC  Level of Consciousness: awake, alert , oriented and patient cooperative  Airway & Oxygen Therapy: Patient Spontanous Breathing  Post-op Assessment: Report given to PACU RN and Post -op Vital signs reviewed and stable  Post vital signs: Reviewed and stable  Complications: No apparent anesthesia complications

## 2013-04-28 NOTE — Anesthesia Procedure Notes (Signed)
Procedure Name: MAC Date/Time: 04/28/2013 8:31 AM Performed by: Carolyne Littles, Nolah Krenzer L Pre-anesthesia Checklist: Patient identified, Timeout performed, Emergency Drugs available, Suction available and Patient being monitored Oxygen Delivery Method: Nasal cannula

## 2013-04-28 NOTE — Progress Notes (Signed)
Patient brought medications that he took this morning. Poor historian of when he last took his other medications. Charted accordingly.

## 2013-04-28 NOTE — H&P (Signed)
I have reviewed the H&P, the patient was re-examined, and I have identified no interval changes in medical condition and plan of care since the history and physical of record  

## 2013-04-28 NOTE — Preoperative (Signed)
Beta Blockers   Reason not to administer Beta Blockers:Not Applicable 

## 2013-04-28 NOTE — Anesthesia Postprocedure Evaluation (Signed)
  Anesthesia Post-op Note  Patient: John Schmidt  Procedure(s) Performed: Procedure(s) with comments: CATARACT EXTRACTION PHACO AND INTRAOCULAR LENS PLACEMENT (IOC) (Left) - CDE:21.0  Patient Location: Short Stay  Anesthesia Type:MAC  Level of Consciousness: awake, alert , oriented and patient cooperative  Airway and Oxygen Therapy: Patient Spontanous Breathing  Post-op Pain: none  Post-op Assessment: Post-op Vital signs reviewed, Patient's Cardiovascular Status Stable, Respiratory Function Stable, Patent Airway, No signs of Nausea or vomiting and Pain level controlled  Post-op Vital Signs: Reviewed and stable  Complications: No apparent anesthesia complications

## 2013-04-28 NOTE — Anesthesia Preprocedure Evaluation (Signed)
Anesthesia Evaluation  Patient identified by MRN, date of birth, ID band Patient awake    Reviewed: Allergy & Precautions, H&P , NPO status , Patient's Chart, lab work & pertinent test results  Airway Mallampati: II TM Distance: >3 FB     Dental  (+) Edentulous Upper and Edentulous Lower   Pulmonary COPDCurrent Smoker,  breath sounds clear to auscultation        Cardiovascular hypertension, Pt. on medications + CAD and + Peripheral Vascular Disease Rhythm:Regular Rate:Normal     Neuro/Psych    GI/Hepatic GERD-  Medicated and Controlled,  Endo/Other    Renal/GU      Musculoskeletal   Abdominal   Peds  Hematology   Anesthesia Other Findings   Reproductive/Obstetrics                           Anesthesia Physical Anesthesia Plan  ASA: III  Anesthesia Plan: MAC   Post-op Pain Management:    Induction: Intravenous  Airway Management Planned: Nasal Cannula  Additional Equipment:   Intra-op Plan:   Post-operative Plan:   Informed Consent: I have reviewed the patients History and Physical, chart, labs and discussed the procedure including the risks, benefits and alternatives for the proposed anesthesia with the patient or authorized representative who has indicated his/her understanding and acceptance.     Plan Discussed with:   Anesthesia Plan Comments:         Anesthesia Quick Evaluation

## 2013-04-30 ENCOUNTER — Encounter (HOSPITAL_COMMUNITY): Payer: Self-pay | Admitting: Ophthalmology

## 2013-05-22 MED ORDER — FENTANYL CITRATE 0.05 MG/ML IJ SOLN: 25.0000 ug | INTRAMUSCULAR | Status: DC | PRN

## 2013-05-22 MED ORDER — ONDANSETRON HCL 4 MG/2ML IJ SOLN: 4.0000 mg | Freq: Once | INTRAMUSCULAR | Status: AC | PRN

## 2013-05-22 NOTE — Patient Instructions (Addendum)
Your procedure is scheduled on: 05/27/2013  Report to [facility] at  7:00   AM.  Call this number if you have problems the morning of surgery: [phone]   Remember:   Do not drink or eat food:After Midnight.  :  Take these medicines the morning of surgery with A SIP OF WATER: Diltiazem, prilosec, and use Albuterol inhaler and Duo Neb treatment   Do not wear jewelry, make-up or nail polish.  Do not wear lotions, powders, or perfumes. You may wear deodorant.  Do not shave 48 hours prior to surgery. Men may shave face and neck.  Do not bring valuables to the hospital.  Contacts, dentures or bridgework may not be worn into surgery.  Leave suitcase in the car. After surgery it may be brought to your room.  For patients admitted to the hospital, checkout time is 11:00 AM the day of discharge.   Patients discharged the day of surgery will not be allowed to drive home.    Special Instructions: Shower using CHG 2 nights before surgery and the night before surgery.  If you shower the day of surgery use CHG.  Use special wash - you have one bottle of CHG for all showers.  You should use approximately 1/3 of the bottle for each shower.   Please read over the following fact sheets that you were given: Pain Booklet, MRSA Information, Surgical Site Infection Prevention and Care and Recovery After Surgery  Transurethral Resection of the Prostate Transurethral resection of the prostate (TURP) is the removal of part of your prostate to treat noncancerous (benign) prostatic hyperplasia (BPH). BPH typically occurs in men older than 40 years. It is the abnormal growth of cells in your prostate. Specifically, it is an abnormal increase in the number of cells that make up your prostate tissue. This causes an increase in the size of your prostate. Often, in the case of BPH, the prostate becomes so large that it compresses the tube that drains urine out of your body from your bladder (urethra). Eventually, this  compression can obstruct the flow of urine from your bladder. This obstruction can cause recurrent bladder infection and difficulties with bladder control and bladder emptying. The goal of TURP is to remove enough prostate tissue to allow for an unobstructed flow of urine, which often resolves the associated conditions. LET YOUR CAREGIVER KNOW ABOUT:  Any allergies you have.  Any medicines you are taking, including herbs, eye drops, over-the-counter medicines, and creams.  Any problems you have had with the use of anesthetics.  Any blood disorders you have, including bleeding problems and clotting problems.  Previous surgeries you have had.  Any prostate infections you have had. RISKS AND COMPLICATIONS Generally, TURP is a safe procedure. However, as with any surgical procedure, complications can occur. Possible complications associated with TURP include:  Difficulty getting an erection.  Scarring, which may cause problems with the flow of your urine.  Injury to your urethra.  Incontinence from injury to the muscle that surrounds your prostate, which controls urine flow.  Infection.  Bleeding.  Injury to your bladder (rare). BEFORE THE PROCEDURE  Your caregiver will tell you when you need to stop eating and drinking. If you take any medicines, your caregiver will tell you which ones you may keep taking and which ones you will have to stop taking and when.  Just before the procedure you will also receive medicine to make you fall asleep (general anesthetic). This will be given through a  tube that is inserted into one of your veins (intravenous [IV] tube). PROCEDURE Your surgeon inserts an instrument that is similar to a telescope with an electric cutting edge (resectoscope) through your urethra to the area of the prostate gland. The cutting edge is used to remove enlarged pieces of your prostate, one piece at a time. At the end of your procedure, a flexible tube (catheter) will be  inserted into your urethra to drain your bladder. Special plastic bags filled with solution will be connected to the end of the catheter. The solution will be used to irrigate blood from your bladder while you heal.  AFTER THE PROCEDURE You will be taken to the recovery area. Once you are awake, stable, and taking fluids well, you will be taken to your hospital room. Typically, you will stay in the hospital 1 2 days after this procedure. The catheter usually is removed before discharge from the hospital. Document Released: 08/21/2005 Document Revised: 05/15/2012 Document Reviewed: 01/22/2012 [facility] Patient Information 2014 [place], [place]. Spinal Anesthesia Care After HOME CARE INSTRUCTIONS  Do not drive or operate machinery for at least 24 hours after receiving anesthesia. Make sure someone is available to drive you home.  Do not drink alcohol for at least 24 hours after receiving anesthesia.  Do not make important decisions for 24 hours after having spinal or epidural anesthesia. Your thinking may be unclear.  Have someone stay with you for at least 24 to 48 hours following surgery.  Drink lots of fluids when you get home. If you are an adult, drink eight, 8 ounce glasses of water per day, or as directed.  Keep your post-operative appointments as suggested. SEEK IMMEDIATE MEDICAL CARE IF:   You develop a fever or any temperature over 100.4 F (38 C).  You have a persistent or severe headache.  You develop blurred or double vision.  You develop dizziness, fainting or lightheadedness.  You have weakness, numbness, or tingling in your arms or legs.  You develop a skin rash.  You have difficulty breathing.  You have persistent nausea and vomiting.  You are unable to pass urine. Document Released: 11/11/2003 Document Revised: 11/13/2011 Document Reviewed: 09/24/2007 [facility] Patient Information 2014 [place], [place].

## 2013-05-23 ENCOUNTER — Encounter (HOSPITAL_COMMUNITY)
Admission: RE | Admit: 2013-05-23 | Discharge: 2013-05-23 | Disposition: A | Discharge status: Home / Self Care | Payer: PRIVATE HEALTH INSURANCE | Source: Ambulatory Visit | Attending: Urology | Admitting: Urology

## 2013-05-23 ENCOUNTER — Encounter (HOSPITAL_COMMUNITY): Payer: Self-pay

## 2013-05-23 DIAGNOSIS — Z01818 Encounter for other preprocedural examination: Secondary | ICD-10-CM | POA: Insufficient documentation

## 2013-05-23 DIAGNOSIS — Z01812 Encounter for preprocedural laboratory examination: Secondary | ICD-10-CM | POA: Insufficient documentation

## 2013-05-23 LAB — CBC
Hemoglobin: 9.8 g/dL — ABNORMAL LOW (ref 13.0–17.0)
MCH: 29.3 pg (ref 26.0–34.0)
MCHC: 32.2 g/dL (ref 30.0–36.0)
Platelets: 229 10*3/uL (ref 150–400)
RDW: 13.8 % (ref 11.5–15.5)

## 2013-05-23 LAB — BASIC METABOLIC PANEL
BUN: 20 mg/dL (ref 6–23)
Calcium: 9.5 mg/dL (ref 8.4–10.5)
Creatinine, Ser: 1.19 mg/dL (ref 0.50–1.35)
GFR calc Af Amer: 62 mL/min — ABNORMAL LOW (ref >90)
GFR calc non Af Amer: 53 mL/min — ABNORMAL LOW (ref >90)
Glucose, Bld: 101 mg/dL — ABNORMAL HIGH (ref 70–99)

## 2013-05-23 LAB — TYPE AND SCREEN: ABO/RH(D): A POS

## 2013-05-27 ENCOUNTER — Encounter (HOSPITAL_COMMUNITY): Payer: Self-pay | Admitting: Anesthesiology

## 2013-05-27 ENCOUNTER — Encounter (HOSPITAL_COMMUNITY): Payer: Self-pay | Admitting: *Deleted

## 2013-05-27 ENCOUNTER — Ambulatory Visit (HOSPITAL_COMMUNITY): Payer: PRIVATE HEALTH INSURANCE | Admitting: Anesthesiology

## 2013-05-27 ENCOUNTER — Observation Stay (HOSPITAL_COMMUNITY)
Admission: RE | Admit: 2013-05-27 | Discharge: 2013-05-28 | Disposition: A | Discharge status: Home / Self Care | Payer: PRIVATE HEALTH INSURANCE | Source: Ambulatory Visit | Attending: Urology | Admitting: Urology

## 2013-05-27 ENCOUNTER — Encounter (HOSPITAL_COMMUNITY)
Admission: RE | Disposition: A | Discharge status: Home / Self Care | Payer: Self-pay | Source: Ambulatory Visit | Attending: Urology

## 2013-05-27 DIAGNOSIS — D649 Anemia, unspecified: Secondary | ICD-10-CM | POA: Insufficient documentation

## 2013-05-27 DIAGNOSIS — K2100 Gastro-esophageal reflux disease with esophagitis, without bleeding: Secondary | ICD-10-CM | POA: Insufficient documentation

## 2013-05-27 DIAGNOSIS — N320 Bladder-neck obstruction: Secondary | ICD-10-CM | POA: Insufficient documentation

## 2013-05-27 DIAGNOSIS — C61 Malignant neoplasm of prostate: Principal | ICD-10-CM | POA: Insufficient documentation

## 2013-05-27 DIAGNOSIS — M069 Rheumatoid arthritis, unspecified: Secondary | ICD-10-CM | POA: Insufficient documentation

## 2013-05-27 HISTORY — PX: TRANSURETHRAL RESECTION OF PROSTATE: SHX73

## 2013-05-27 SURGERY — TURP (TRANSURETHRAL RESECTION OF PROSTATE)
Anesthesia: Monitor Anesthesia Care | Site: Prostate | Wound class: Clean Contaminated

## 2013-05-27 MED ORDER — HYDROCODONE-ACETAMINOPHEN 10-325 MG PO TABS: 1.0000 | ORAL_TABLET | Freq: Four times a day (QID) | ORAL | Status: DC | PRN

## 2013-05-27 MED ORDER — LACTATED RINGERS IV SOLN
INTRAVENOUS | Status: DC
  Administered 2013-05-27 (×2): via INTRAVENOUS

## 2013-05-27 MED ORDER — FOLIC ACID 1 MG PO TABS: 1.0000 mg | ORAL_TABLET | Freq: Every day | ORAL | Status: DC

## 2013-05-27 MED ORDER — DOXEPIN HCL 10 MG PO CAPS: 10.0000 mg | ORAL_CAPSULE | Freq: Every day | ORAL | Status: DC

## 2013-05-27 MED ORDER — ACETAMINOPHEN ER 650 MG PO TBCR: 650.0000 mg | EXTENDED_RELEASE_TABLET | Freq: Three times a day (TID) | ORAL | Status: DC

## 2013-05-27 MED ORDER — ASPIRIN 81 MG PO CHEW
81.0000 mg | CHEWABLE_TABLET | Freq: Every day | ORAL | Status: DC
Start: 2013-05-27 — End: 2013-05-28
  Administered 2013-05-27 – 2013-05-28 (×2): 81 mg via ORAL
  Filled 2013-05-27 (×2): qty 1

## 2013-05-27 MED ORDER — ACETAMINOPHEN 325 MG PO TABS
650.0000 mg | ORAL_TABLET | Freq: Three times a day (TID) | ORAL | Status: DC
  Administered 2013-05-27 – 2013-05-28 (×2): 650 mg via ORAL
  Filled 2013-05-27 (×2): qty 2

## 2013-05-27 MED ORDER — PROPOFOL INFUSION 10 MG/ML OPTIME
INTRAVENOUS | Status: DC | PRN
  Administered 2013-05-27: 20 ug/kg/min via INTRAVENOUS

## 2013-05-27 MED ORDER — DILTIAZEM HCL ER COATED BEADS 120 MG PO CP24
120.0000 mg | ORAL_CAPSULE | Freq: Every day | ORAL | Status: DC
  Administered 2013-05-27 – 2013-05-28 (×2): 120 mg via ORAL
  Filled 2013-05-27 (×2): qty 1

## 2013-05-27 MED ORDER — LINACLOTIDE 290 MCG PO CAPS: 290.0000 ug | ORAL_CAPSULE | Freq: Every day | ORAL | Status: DC

## 2013-05-27 MED ORDER — SODIUM CHLORIDE 0.9 % IR SOLN
Status: DC | PRN
  Administered 2013-05-27: 3000 mL via INTRAVESICAL

## 2013-05-27 MED ORDER — POTASSIUM CHLORIDE CRYS ER 10 MEQ PO TBCR
10.0000 meq | EXTENDED_RELEASE_TABLET | Freq: Every day | ORAL | Status: DC
  Administered 2013-05-28: 10 meq via ORAL
  Filled 2013-05-27: qty 1

## 2013-05-27 MED ORDER — MIDAZOLAM HCL 2 MG/2ML IJ SOLN
1.0000 mg | INTRAMUSCULAR | Status: DC | PRN
  Administered 2013-05-27 (×2): 1 mg via INTRAVENOUS

## 2013-05-27 MED ORDER — POTASSIUM CHLORIDE ER 10 MEQ PO TBCR: 10.0000 meq | EXTENDED_RELEASE_TABLET | Freq: Every day | ORAL | Status: DC

## 2013-05-27 MED ORDER — MIDAZOLAM HCL 5 MG/5ML IJ SOLN
INTRAMUSCULAR | Status: DC | PRN
  Administered 2013-05-27: 1 mg via INTRAVENOUS

## 2013-05-27 MED ORDER — CYCLOBENZAPRINE HCL 10 MG PO TABS: 10.0000 mg | ORAL_TABLET | Freq: Every day | ORAL | Status: DC

## 2013-05-27 MED ORDER — FENTANYL CITRATE 0.05 MG/ML IJ SOLN
INTRAMUSCULAR | Status: DC | PRN
  Administered 2013-05-27: 25 ug via INTRAVENOUS

## 2013-05-27 MED ORDER — ONDANSETRON HCL 4 MG/2ML IJ SOLN: 4.0000 mg | Freq: Once | INTRAMUSCULAR | Status: DC | PRN

## 2013-05-27 MED ORDER — ALBUTEROL SULFATE HFA 108 (90 BASE) MCG/ACT IN AERS: 2.0000 | INHALATION_SPRAY | RESPIRATORY_TRACT | Status: DC | PRN

## 2013-05-27 MED ORDER — FENTANYL CITRATE 0.05 MG/ML IJ SOLN: 25.0000 ug | INTRAMUSCULAR | Status: DC | PRN

## 2013-05-27 MED ORDER — FUROSEMIDE 20 MG PO TABS: 20.0000 mg | ORAL_TABLET | ORAL | Status: DC

## 2013-05-27 MED ORDER — ASPIRIN EC 81 MG PO TBEC: 81.0000 mg | DELAYED_RELEASE_TABLET | Freq: Every day | ORAL | Status: DC

## 2013-05-27 MED ORDER — GLYCINE 1.5 % IR SOLN
Status: DC | PRN
  Administered 2013-05-27 (×5): 3000 mL

## 2013-05-27 MED ORDER — MORPHINE SULFATE 4 MG/ML IJ SOLN: 2.0000 mg | INTRAMUSCULAR | Status: DC | PRN

## 2013-05-27 MED ORDER — TAMSULOSIN HCL 0.4 MG PO CAPS: 0.4000 mg | ORAL_CAPSULE | Freq: Every day | ORAL | Status: DC

## 2013-05-27 MED ORDER — TRAMADOL HCL 50 MG PO TABS: 50.0000 mg | ORAL_TABLET | Freq: Four times a day (QID) | ORAL | Status: DC | PRN

## 2013-05-27 MED ORDER — FOLIC ACID 1 MG PO TABS
1.0000 mg | ORAL_TABLET | Freq: Every day | ORAL | Status: DC
  Administered 2013-05-28: 1 mg via ORAL
  Filled 2013-05-27: qty 1

## 2013-05-27 MED ORDER — CYCLOBENZAPRINE HCL 10 MG PO TABS
10.0000 mg | ORAL_TABLET | Freq: Every morning | ORAL | Status: DC
  Administered 2013-05-28: 10 mg via ORAL
  Filled 2013-05-27: qty 1

## 2013-05-27 MED ORDER — IPRATROPIUM-ALBUTEROL 0.5-2.5 (3) MG/3ML IN SOLN: 3.0000 mL | Freq: Four times a day (QID) | RESPIRATORY_TRACT | Status: DC | PRN

## 2013-05-27 MED ORDER — MIDAZOLAM HCL 2 MG/2ML IJ SOLN
INTRAMUSCULAR | Status: AC
  Filled 2013-05-27: qty 2

## 2013-05-27 MED ORDER — METHOTREXATE CHEMO INJECTION (PF) 1 GM **50 MG/ML**: 1.0000 mg | Freq: Once | INTRAMUSCULAR | Status: DC

## 2013-05-27 MED ORDER — FENTANYL CITRATE 0.05 MG/ML IJ SOLN
INTRAMUSCULAR | Status: AC
  Filled 2013-05-27: qty 1

## 2013-05-27 MED ORDER — DEXTROSE-NACL 5-0.45 % IV SOLN: INTRAVENOUS | Status: DC

## 2013-05-27 MED ORDER — DEXTROSE-NACL 5-0.45 % IV SOLN
INTRAVENOUS | Status: DC
  Administered 2013-05-27: 17:00:00 via INTRAVENOUS

## 2013-05-27 MED ORDER — MORPHINE SULFATE 2 MG/ML IJ SOLN
2.0000 mg | INTRAMUSCULAR | Status: DC | PRN
  Administered 2013-05-27 – 2013-05-28 (×3): 2 mg via INTRAVENOUS
  Filled 2013-05-27 (×3): qty 1

## 2013-05-27 MED ORDER — LACTULOSE 10 GM/15ML PO SOLN: 20.0000 g | Freq: Every day | ORAL | Status: DC | PRN

## 2013-05-27 MED ORDER — CIPROFLOXACIN IN D5W 200 MG/100ML IV SOLN
200.0000 mg | Freq: Once | INTRAVENOUS | Status: AC
  Administered 2013-05-27: 200 mg via INTRAVENOUS

## 2013-05-27 MED ORDER — CIPROFLOXACIN IN D5W 200 MG/100ML IV SOLN
INTRAVENOUS | Status: AC
  Filled 2013-05-27: qty 100

## 2013-05-27 MED ORDER — STERILE WATER FOR IRRIGATION IR SOLN
Status: DC | PRN
  Administered 2013-05-27: 1000 mL

## 2013-05-27 MED ORDER — BUPIVACAINE IN DEXTROSE 0.75-8.25 % IT SOLN
INTRATHECAL | Status: AC
  Filled 2013-05-27: qty 4

## 2013-05-27 MED ORDER — DILTIAZEM HCL ER BEADS 120 MG PO CP24: 120.0000 mg | ORAL_CAPSULE | Freq: Every day | ORAL | Status: DC

## 2013-05-27 SURGICAL SUPPLY — 31 items
BAG DECANTER FOR FLEXI CONT (MISCELLANEOUS) ×2 IMPLANT
BAG DRAIN URO TABLE W/ADPT NS (DRAPE) ×2 IMPLANT
BAG URINE DRAIN TURP 4L (OSTOMY) ×2 IMPLANT
CABLE HI FREQUENCY MONOPOLAR (ELECTROSURGICAL) ×2 IMPLANT
CATH FOLEY 3WAY 30CC 22F (CATHETERS) ×2 IMPLANT
CLOTH BEACON ORANGE TIMEOUT ST (SAFETY) ×2 IMPLANT
CONNECTOR 5 IN 1 STRAIGHT STRL (MISCELLANEOUS) ×2 IMPLANT
DRAPE STERI URO 23X35 APER SZ5 (DRAPE) ×2 IMPLANT
DRAPE WARM FLUID 44X44 (DRAPE) ×2 IMPLANT
ELECT CUT LOOP C-MAX 27FR .012 (CUTTING LOOP) ×2
ELECT REM PT RETURN 9FT ADLT (ELECTROSURGICAL) ×2
ELECTRODE CUT LP CMX 27FR .012 (CUTTING LOOP) ×1 IMPLANT
ELECTRODE REM PT RTRN 9FT ADLT (ELECTROSURGICAL) ×1 IMPLANT
FORMALIN 10 PREFIL 480ML (MISCELLANEOUS) ×2 IMPLANT
GLOVE BIO SURGEON STRL SZ7 (GLOVE) ×2 IMPLANT
GLOVE BIOGEL PI IND STRL 7.0 (GLOVE) ×2 IMPLANT
GLOVE BIOGEL PI INDICATOR 7.0 (GLOVE) ×2
GLOVE ECLIPSE 6.5 STRL STRAW (GLOVE) ×2 IMPLANT
GLOVE EXAM NITRILE LRG STRL (GLOVE) ×2 IMPLANT
GLYCINE 1.5% IRRIG UROMATIC (IV SOLUTION) ×10 IMPLANT
GOWN STRL REIN XL XLG (GOWN DISPOSABLE) ×2 IMPLANT
IV NS IRRIG 3000ML ARTHROMATIC (IV SOLUTION) ×4 IMPLANT
KIT ROOM TURNOVER AP CYSTO (KITS) ×2 IMPLANT
MANIFOLD NEPTUNE II (INSTRUMENTS) ×2 IMPLANT
PACK CYSTO (CUSTOM PROCEDURE TRAY) ×2 IMPLANT
PAD ARMBOARD 7.5X6 YLW CONV (MISCELLANEOUS) ×2 IMPLANT
SET IRRIGATING DISP (SET/KITS/TRAYS/PACK) ×2 IMPLANT
SYR 30ML LL (SYRINGE) ×2 IMPLANT
TOWEL OR 17X26 4PK STRL BLUE (TOWEL DISPOSABLE) ×2 IMPLANT
WATER STERILE IRR 1000ML POUR (IV SOLUTION) ×2 IMPLANT
YANKAUER SUCT BULB TIP 10FT TU (MISCELLANEOUS) ×2 IMPLANT

## 2013-05-27 NOTE — Anesthesia Procedure Notes (Addendum)
Spinal  Patient location during procedure: OR Start time: 05/27/2013 10:19 AM Staffing CRNA/Resident: Glynn Octave E Preanesthetic Checklist Completed: patient identified, site marked, surgical consent, pre-op evaluation, timeout performed, IV checked, risks and benefits discussed and monitors and equipment checked Spinal Block Patient position: left lateral decubitus Prep: Betadine Patient monitoring: heart rate, cardiac monitor, continuous pulse ox and blood pressure Approach: left paramedian Location: L3-4 Injection technique: single-shot Needle Needle type: Spinocan  Needle gauge: 22 G Needle length: 9 cm Assessment Sensory level: T8 Additional Notes  ATTEMPTS:2  Dr. Jayme Cloud in after CRNA experienced difficulty.  Block obtained by Dr. Jayme Cloud.  TRAY ZO:[id number] TRAY EXPIRATION DATE:08/2013  Spinal  Patient location during procedure: OR Start time: 05/27/2013 10:44 AM Staffing Anesthesiologist: Laurene Footman Preanesthetic Checklist Completed: patient identified, site marked, surgical consent, pre-op evaluation, timeout performed, IV checked, risks and benefits discussed and monitors and equipment checked Spinal Block Patient position: left lateral decubitus Prep: Betadine Patient monitoring: heart rate, cardiac monitor, continuous pulse ox and blood pressure Approach: left paramedian Location: L3-4 Injection technique: single-shot Needle Needle type: Spinocan  Needle gauge: 22 G Needle length: 9 cm Assessment Sensory level: T8 Additional Notes  ATTEMPTS:1 TRAY UJ:[id number] TRAY EXPIRATION DATE:08/2013

## 2013-05-27 NOTE — Anesthesia Postprocedure Evaluation (Signed)
  Anesthesia Post-op Note  Patient: John Bayley Sr.  Procedure(s) Performed: Procedure(s): TRANSURETHRAL RESECTION OF THE PROSTATE (TURP) (N/A)  Patient Location: PACU  Anesthesia Type:Spinal  Level of Consciousness: awake  Airway and Oxygen Therapy: Patient Spontanous Breathing and Patient connected to nasal cannula oxygen  Post-op Pain: none  Post-op Assessment: Post-op Vital signs reviewed, Patient's Cardiovascular Status Stable, Respiratory Function Stable, Patent Airway and No signs of Nausea or vomiting  Post-op Vital Signs: Reviewed and stable131/57, 54, 13, 100, 36.0  Complications: No apparent anesthesia complications

## 2013-05-27 NOTE — Brief Op Note (Signed)
05/27/2013  11:24 AM  PATIENT:  John Bayley Sr.  80 y.o. male  PRE-OPERATIVE DIAGNOSIS:  benign prostatic hypertrophy  POST-OPERATIVE DIAGNOSIS:  benign prostatic hypertrophy  PROCEDURE:  Procedure(s): TRANSURETHRAL RESECTION OF THE PROSTATE (TURP) (N/A)  SURGEON:  Surgeon(s) and Role:    * Ky Barban, MD - Primary  PHYSICIAN ASSISTANT:   ASSISTANTS: none   ANESTHESIA:   spinal  EBL:  Total I/O In: 1000 [I.V.:1000] Out: -   BLOOD ADMINISTERED:none  DRAINS: Urinary Catheter (Foley)   LOCAL MEDICATIONS USED:  NONE  SPECIMEN:  Source of Specimen:  prostate chips  DISPOSITION OF SPECIMEN:  PATHOLOGY  COUNTS:  YES  TOURNIQUET:  * No tourniquets in log *  DICTATION: .Other Dictation: Dictation Number dictation [id number]  PLAN OF CARE: Admit for overnight observation  PATIENT DISPOSITION:  PACU - hemodynamically stable.   Delay start of Pharmacological VTE agent (>24hrs) due to surgical blood loss or risk of bleeding:

## 2013-05-27 NOTE — Transfer of Care (Signed)
Immediate Anesthesia Transfer of Care Note  Patient: John Caples Sr.  Procedure(s) Performed: Procedure(s): TRANSURETHRAL RESECTION OF THE PROSTATE (TURP) (N/A)  Patient Location: PACU  Anesthesia Type:Spinal  Level of Consciousness: awake and alert   Airway & Oxygen Therapy: Patient Spontanous Breathing and Patient connected to nasal cannula oxygen  Post-op Assessment: Report given to PACU RN and Post -op Vital signs reviewed and stable  Post vital signs: Reviewed and stable  Complications: No apparent anesthesia complications

## 2013-05-27 NOTE — Progress Notes (Signed)
No change in H&P on reexamination. 

## 2013-05-27 NOTE — H&P (Signed)
NAMEGERHART, RUGGIERI               ACCOUNT NO.:  [id number]  MEDICAL RECORD NO.:  [id number]  LOCATION:  [facility]                          FACILITY:  [facility]  PHYSICIAN:  Ky Barban, M.D.DATE OF BIRTH:  12/18/1924  DATE OF ADMISSION:  05/27/2013 DATE OF DISCHARGE:  LH                             HISTORY & PHYSICAL   CHIEF COMPLAINT:  Symptoms of prostatism.  HISTORY:  An 79 year old gentleman who is well-known to me.  In 2009, I have done TUR prostate for BPH.  He was doing fine, but now he says that he is having difficulty to void again, urinary stream is slow and weak, so I did a systematic cystoscopy again in the office recently.  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  I have told him about the nature of this procedure and also I discussed with his son and who was with him.  No fever or chills.  No hematuria.  He understands no guarantee about the results and he is coming as an outpatient.  I will do a TUR prostate, keep him overnight in the hospital.  Next day, he will go home with the catheter if everything is okay.  His systematics are completely normal.  PAST MEDICAL HISTORY:  BPH with TUR prostate in 2009.  No other significant medical problems.  He does have hypertension, takes medicines.  Also has rheumatoid arthritis and reflux esophagitis.  PERSONAL HISTORY:  He does not smoke or drink.  REVIEW OF SYSTEMS:  Unremarkable.  FAMILY HISTORY:  No history of prostate cancer.  PHYSICAL EXAMINATION:  GENERAL:  Moderately built male, not in acute distress, fully conscious, alert, oriented. VITAL SIGNS:  His blood pressure is 140/80, temperature is normal. CENTRAL NERVOUS SYSTEM:  No gross neurological deficit. HEAD, NECK, EYE, ENT:  Negative. CHEST:  Symmetrical.  Normal breath sounds. HEART:  Regular sinus rhythm.  No murmur. ABDOMEN:  Soft,  flat.  Liver, spleen, and kidneys are not palpable.  No CVA tenderness. RECTAL:  The sphincter is normal; no rectal mass.  Prostate 1.5+ smooth and firm.  IMPRESSION:  Benign prostatic hypertrophy with bladder neck obstruction.  DIAGNOSES: 1. Hypertension. 2. Rheumatoid arthritis. 3. Reflux esophagitis.  PLAN:  TUR prostate under anesthesia and keep him overnight in the hospital.     Ky Barban, M.D.     MIJ/MEDQ  D:  05/26/2013  T:  05/27/2013  Job:  [id number]

## 2013-05-27 NOTE — Anesthesia Preprocedure Evaluation (Addendum)
Anesthesia Evaluation  Patient identified by MRN, date of birth, ID band Patient awake    Reviewed: Allergy & Precautions, H&P , NPO status , Patient's Chart, lab work & pertinent test results  Airway Mallampati: II TM Distance: >3 FB     Dental  (+) Edentulous Upper and Edentulous Lower   Pulmonary COPDCurrent Smoker,  breath sounds clear to auscultation        Cardiovascular hypertension, Pt. on medications + CAD and + Peripheral Vascular Disease Rhythm:Regular Rate:Normal     Neuro/Psych    GI/Hepatic GERD-  Medicated and Controlled,  Endo/Other    Renal/GU      Musculoskeletal   Abdominal   Peds  Hematology   Anesthesia Other Findings   Reproductive/Obstetrics                           Anesthesia Physical Anesthesia Plan  ASA: III  Anesthesia Plan: Spinal and MAC   Post-op Pain Management:    Induction: Intravenous  Airway Management Planned: Nasal Cannula  Additional Equipment:   Intra-op Plan:   Post-operative Plan:   Informed Consent: I have reviewed the patients History and Physical, chart, labs and discussed the procedure including the risks, benefits and alternatives for the proposed anesthesia with the patient or authorized representative who has indicated his/her understanding and acceptance.     Plan Discussed with:   Anesthesia Plan Comments:         Anesthesia Quick Evaluation

## 2013-05-28 LAB — CBC
Hemoglobin: 9.6 g/dL — ABNORMAL LOW (ref 13.0–17.0)
MCHC: 32.5 g/dL (ref 30.0–36.0)
Platelets: 172 10*3/uL (ref 150–400)
RBC: 3.27 MIL/uL — ABNORMAL LOW (ref 4.22–5.81)
WBC: 5.5 10*3/uL (ref 4.0–10.5)

## 2013-05-28 LAB — BASIC METABOLIC PANEL
BUN: 12 mg/dL (ref 6–23)
CO2: 30 mEq/L (ref 19–32)
Calcium: 8.8 mg/dL (ref 8.4–10.5)
Creatinine, Ser: 1.03 mg/dL (ref 0.50–1.35)
GFR calc Af Amer: 74 mL/min — ABNORMAL LOW (ref >90)
GFR calc non Af Amer: 64 mL/min — ABNORMAL LOW (ref >90)
Glucose, Bld: 91 mg/dL (ref 70–99)
Sodium: 139 mEq/L (ref 135–145)

## 2013-05-28 MED ORDER — ASPIRIN 81 MG PO CHEW: 81.0000 mg | CHEWABLE_TABLET | Freq: Every day | ORAL | Status: AC

## 2013-05-28 MED ORDER — CYCLOBENZAPRINE HCL 10 MG PO TABS: 10.0000 mg | ORAL_TABLET | Freq: Three times a day (TID) | ORAL | Status: DC | PRN

## 2013-05-28 MED ORDER — BUPIVACAINE IN DEXTROSE 0.75-8.25 % IT SOLN
INTRATHECAL | Status: DC | PRN
  Administered 2013-05-27: 15 mg via INTRATHECAL

## 2013-05-28 NOTE — Discharge Summary (Signed)
  Discharge summary#[id number]

## 2013-05-28 NOTE — Progress Notes (Signed)
Patient received discharge instructions along with follow up appointments and prescriptions. Patient verbalized understanding of all instructions. Patient was discharged with foley in place per doctor order. Patient was escorted by staff via wheelchair to vehicle. Patient discharged to home in stable condition.

## 2013-05-28 NOTE — Anesthesia Postprocedure Evaluation (Signed)
  Anesthesia Post-op Note  Patient: John Bayley Sr.  Procedure(s) Performed: Procedure(s): TRANSURETHRAL RESECTION OF THE PROSTATE (TURP) (N/A)  Patient Location: Room 306  Anesthesia Type:Spinal  Level of Consciousness: awake, alert , oriented and patient cooperative  Airway and Oxygen Therapy: Patient Spontanous Breathing and Patient connected to nasal cannula oxygen  Post-op Pain: mild  Post-op Assessment: Post-op Vital signs reviewed, Patient's Cardiovascular Status Stable, Respiratory Function Stable, Patent Airway, No signs of Nausea or vomiting and Pain level controlled  Post-op Vital Signs: Reviewed and stable  Complications: No apparent anesthesia complications

## 2013-05-28 NOTE — Progress Notes (Signed)
Note #[id number]

## 2013-05-28 NOTE — Progress Notes (Signed)
UR Chart Review Completed  

## 2013-05-28 NOTE — Progress Notes (Signed)
INITIAL NUTRITION ASSESSMENT  DOCUMENTATION CODES Per approved criteria  -Not Applicable   INTERVENTION: Encouraged pt to add oral nutrition supplement 2x per day to improve nutritional intake  NUTRITION DIAGNOSIS: Predicted suboptimal oral intake related to decreased appetite as evidenced by pt nutr hx.   Goal: Pt to meet >/= 90% of their estimated nutrition needs   Monitor:  Po intake, labs and wt trends  Reason for Assessment: Malnutrition Screen Score = 2  79 y.o. male   ASSESSMENT: Pt s/p Transurethral resection of prostate today by Dr. Frann Rider. He is c/o being hungry . Diet advanced and pt to be discharged later today per nursing. Unable to complete nutrition focused physical exam at this time.   Height: Ht Readings from Last 1 Encounters:  05/27/13 5\' 11"  (1.803 m)    Weight: Wt Readings from Last 1 Encounters:  05/27/13 132 lb (59.875 kg)    Ideal Body Weight: 172# (78 kg)  % Ideal Body Weight: 77%  Wt Readings from Last 10 Encounters:  05/27/13 132 lb (59.875 kg)  05/27/13 132 lb (59.875 kg)  05/23/13 132 lb (59.875 kg)  04/23/13 132 lb (59.875 kg)    Usual Body Weight: 125# per pt  % Usual Body Weight: 105%  BMI:  Body mass index is 18.42 kg/(m^2).underweight  Estimated Nutritional Needs: Kcal: 1800-2100 (for gradual wt gain) Protein: 70-80 gr Fluid: 1800 ml/day  Skin: left leg circular areas x 3 healing  Diet Order: Dysphagia 3 thin liquids  EDUCATION NEEDS: -Education needs addressed   Intake/Output Summary (Last 24 hours) at 05/28/13 1135 Last data filed at 05/28/13 1129  Gross per 24 hour  Intake 16673.33 ml  Output  16109 ml  Net -8301.67 ml    Last BM:    Labs:   Recent Labs Lab 05/23/13 1450 05/28/13 0509  NA 135 139  K 4.1 4.4  CL 98 105  CO2 29 30  BUN 20 12  CREATININE 1.19 1.03  CALCIUM 9.5 8.8  GLUCOSE 101* 91    CBG (last 3)  No results found for this basename: GLUCAP,  in the last 72 hours  Scheduled  Meds: . acetaminophen  650 mg Oral TID  . aspirin  81 mg Oral Daily  . cyclobenzaprine  10 mg Oral q morning - 10a  . diltiazem  120 mg Oral Daily  . folic acid  1 mg Oral Daily  . potassium chloride  10 mEq Oral Daily    Continuous Infusions: . dextrose 5 % and 0.45% NaCl 100 mL/hr at 05/27/13 1718    Past Medical History  Diagnosis Date  . GERD (gastroesophageal reflux disease)   . COPD (chronic obstructive pulmonary disease)   . Peripheral vascular disease   . Hypertension   . Coronary artery disease   . Gout   . Arthritis     Rheumatoid    Past Surgical History  Procedure Laterality Date  . Hernia repair  1960's  . Poor historian      Patient poor historian on surgical procedures;spoke to Dr. Francee Nodal office concerning medical history.  . Cataract extraction w/phaco Left 04/28/2013    Procedure: CATARACT EXTRACTION PHACO AND INTRAOCULAR LENS PLACEMENT (IOC);  Surgeon: Gemma Payor, MD;  Location: [facility];  Service: Ophthalmology;  Laterality: Left;  CDE:21.0  . Transurethral resection of prostate N/A 05/27/2013    Procedure: TRANSURETHRAL RESECTION OF THE PROSTATE (TURP);  Surgeon: Ky Barban, MD;  Location: [facility];  Service: Urology;  Laterality: N/A;  Colman Cater MS,RD,LDN,CSG Office: [phone] Pager: [phone]

## 2013-05-28 NOTE — Addendum Note (Signed)
Addendum created 05/28/13 1055 by Marolyn Hammock, CRNA   Modules edited: Anesthesia Medication Administration

## 2013-05-28 NOTE — Op Note (Signed)
John Schmidt, John Schmidt               ACCOUNT NO.:  [id number]  MEDICAL RECORD NO.:  [id number]  LOCATION:  A306                          FACILITY:  [facility]  PHYSICIAN:  Ky Barban, M.D.DATE OF BIRTH:  12/18/1924  DATE OF PROCEDURE:  05/27/2013 DATE OF DISCHARGE:                              OPERATIVE REPORT   PREOPERATIVE DIAGNOSIS:  Benign prostatic hyperplasia.  POSTOPERATIVE DIAGNOSIS:  Benign prostatic hyperplasia.  PROCEDURE:  Transurethral resection of the prostate.  ANESTHESIA:  Spinal.  PROCEDURE:  The patient under spinal anesthesia in lithotomy position. After usual prep and drape, #28 Iglesias resectoscope was introduced after introducing a [employer] sound to a #30-French.  Once the resectoscope was introduced, the bladder was inspected again along with the prostatic urethra.  There was lateral lobe hypertrophy causing bladder neck obstruction and shows a previous evidence of TUR prostate. Non-resectoscope was pulled back at the mid prostatic urethra.  Bladder neck was circumferentially resected down to the circular fibers.  The bleeders were coagulated.  Resectoscope was pulled back at the level of the verumontanum.  The right lobe was resected between 11 and 7 o'clock positions.  Similarly, the left lobe was resected between 1 and 5 o'clock position.  The posterior midline tissue was resected.  Next, very carefully not to injure the sphincter of the verumontanum and there was a small amount of tissue in the anterior midline which was resected at the end.  Prostatic urethra looks wide open.  Chips were evacuated. Bleeders were coagulated.  The resectoscope was removed.  Then, a #22 three-way Foley catheter left for drainage.  CBI started which was clear.  The patient left the operating room in satisfactory condition.     Ky Barban, M.D.     MIJ/MEDQ  D:  05/27/2013  T:  05/28/2013  Job:  [id number]

## 2013-05-28 NOTE — Care Management Note (Signed)
    Page 1 of 1   05/28/2013     1:27:12 PM   CARE MANAGEMENT NOTE 05/28/2013  Patient:  John Schmidt, John Schmidt   Account Number:  [id number]  Date Initiated:  05/28/2013  Documentation initiated by:  Sharrie Rothman  Subjective/Objective Assessment:   Pt admitted from home s/p TURP.Pt lives with his wife and will return home at discharge. Pt has a son who is very active in the care of the pt. Pt has a cane and walker for home use. Pt will discharge home with foley.     Action/Plan:   Pt has agreed to [facility] RN for foley monitoring. Alroy Bailiff of [facility] is aware and will collect pts information from the chart and services will start within 48 hours of discharge. Pts nurse aware of d/c arrangements.   Anticipated DC Date:  05/28/2013   Anticipated DC Plan:  HOME W HOME HEALTH SERVICES      DC Planning Services  CM consult      [facility] Choice  HOME HEALTH   Choice offered to / List presented to:  C-4 Adult Children        HH arranged  HH-1 RN      [facility] agency  [facility]   Status of service:  Completed, signed off Medicare Important Message given?   (If response is "NO", the following Medicare IM given date fields will be blank) Date Medicare IM given:   Date Additional Medicare IM given:    Discharge Disposition:  HOME W HOME HEALTH SERVICES  Per UR Regulation:    If discussed at Long Length of Stay Meetings, dates discussed:    Comments:  05/28/13 1326 Arlyss Queen, RN BSN CM

## 2013-05-29 NOTE — Progress Notes (Signed)
John Schmidt, John Schmidt               ACCOUNT NO.:  [id number]  MEDICAL RECORD NO.:  [id number]  LOCATION:  A306                          FACILITY:  [facility]  PHYSICIAN:  Ky Barban, M.D.DATE OF BIRTH:  12/18/1924  DATE OF PROCEDURE: DATE OF DISCHARGE:  05/28/2013                                PROGRESS NOTE   Mr. Balis had a TUR prostate done yesterday.  Clinically, he is doing well.  His temperature is 98.4, pulse 75 per minute, blood pressure 127/56, and O2 saturation 100% on room air.  His urine is clear.  CBI has been discontinued.  His hematocrit is 29.5.  His sodium is 139, potassium 4.4, chloride 105, CO2 is 30, his BUN is 12, creatinine 1.03. His pathology report, I do not have the final report but the pathologist called me that he has a prostate cancer.  I will get the final report, then I will make arrangement if he needs any further treatment but does not look like because of his age, I may just follow him.  He is going to go home today with Foley catheter which I will take it out in couple of weeks, and I have told his son to continue his home medicines.  I will see him back in 2 weeks.  We are going to make arrangement for a home visiting nurse and if there is any problem like fever, chills, or bleeding to let me know.  FINAL DISCHARGE DIAGNOSES:  Prostate cancer, hypertension, and rheumatoid arthritis.  Discharge condition is improved.     Ky Barban, M.D.     MIJ/MEDQ  D:  05/28/2013  T:  05/29/2013  Job:  [id number]

## 2013-05-29 NOTE — Discharge Summary (Signed)
John Schmidt, John Schmidt               ACCOUNT NO.:  [id number]  MEDICAL RECORD NO.:  [id number]  LOCATION:  A306                          FACILITY:  [facility]  PHYSICIAN:  Ky Barban, M.D.DATE OF BIRTH:  12/18/1924  DATE OF ADMISSION:  05/27/2013 DATE OF DISCHARGE:  09/24/2014LH                              DISCHARGE SUMMARY   HOSPITAL COURSE:  An 79 year old gentleman.  In 2009, I have done TUR prostate for symptoms of BPH and he started to have symptoms of prostatism again, he was worked up again, found to have regrowth of the prostate causing bladder neck obstruction.  So, I advised him to undergo TUR prostate.  He underwent preadmission workup, CBC and urinalysis is normal.  His hematocrit is 30.1, he is anemic.  He has chronic anemia because of rheumatoid arthritis.  So, he was brought to the OR yesterday, underwent TUR prostate.  Overnight, he has done well.  CBI is absolutely clear, so I stopped his CBI this morning.  He is up and about in the chair, eating regular food, so I am going to discharge him home with Foley catheter, which I want to keep couple of weeks.  Pathologist has called me that he has prostate cancer and I do not have the final report yet.  When he comes back, I will discuss with him.  I am going to see him back in 2 weeks in the office and then take the catheter out.  I have discussed this with his son, I told them about the diagnosis.  He is going home.  He can resume his blood pressure and rheumatoid arthritis medicines at home, I do not have to give him anything.  If he has any bleeding or fever over 101, I told them to call me.  FINAL DISCHARGE DIAGNOSES: 1. Prostate cancer. 2. Hypertension. 3. Rheumatoid arthritis.  DISCHARGE CONDITION:  Improved.     Ky Barban, M.D.     MIJ/MEDQ  D:  05/28/2013  T:  05/29/2013  Job:  [id number]

## 2013-06-01 ENCOUNTER — Emergency Department (HOSPITAL_COMMUNITY)
Admission: EM | Admit: 2013-06-01 | Discharge: 2013-06-01 | Disposition: A | Discharge status: Home / Self Care | Payer: PRIVATE HEALTH INSURANCE | Attending: Emergency Medicine | Admitting: Emergency Medicine

## 2013-06-01 ENCOUNTER — Encounter (HOSPITAL_COMMUNITY): Payer: Self-pay | Admitting: *Deleted

## 2013-06-01 DIAGNOSIS — M109 Gout, unspecified: Secondary | ICD-10-CM | POA: Insufficient documentation

## 2013-06-01 DIAGNOSIS — Z9889 Other specified postprocedural states: Secondary | ICD-10-CM | POA: Insufficient documentation

## 2013-06-01 DIAGNOSIS — I10 Essential (primary) hypertension: Secondary | ICD-10-CM | POA: Insufficient documentation

## 2013-06-01 DIAGNOSIS — I739 Peripheral vascular disease, unspecified: Secondary | ICD-10-CM | POA: Insufficient documentation

## 2013-06-01 DIAGNOSIS — J4489 Other specified chronic obstructive pulmonary disease: Secondary | ICD-10-CM | POA: Insufficient documentation

## 2013-06-01 DIAGNOSIS — R319 Hematuria, unspecified: Secondary | ICD-10-CM | POA: Insufficient documentation

## 2013-06-01 DIAGNOSIS — M069 Rheumatoid arthritis, unspecified: Secondary | ICD-10-CM | POA: Insufficient documentation

## 2013-06-01 DIAGNOSIS — F17200 Nicotine dependence, unspecified, uncomplicated: Secondary | ICD-10-CM | POA: Insufficient documentation

## 2013-06-01 DIAGNOSIS — I2510 Atherosclerotic heart disease of native coronary artery without angina pectoris: Secondary | ICD-10-CM | POA: Insufficient documentation

## 2013-06-01 DIAGNOSIS — Z7982 Long term (current) use of aspirin: Secondary | ICD-10-CM | POA: Insufficient documentation

## 2013-06-01 DIAGNOSIS — N4889 Other specified disorders of penis: Secondary | ICD-10-CM | POA: Insufficient documentation

## 2013-06-01 DIAGNOSIS — N489 Disorder of penis, unspecified: Secondary | ICD-10-CM | POA: Insufficient documentation

## 2013-06-01 DIAGNOSIS — Z79899 Other long term (current) drug therapy: Secondary | ICD-10-CM | POA: Insufficient documentation

## 2013-06-01 DIAGNOSIS — J449 Chronic obstructive pulmonary disease, unspecified: Secondary | ICD-10-CM | POA: Insufficient documentation

## 2013-06-01 LAB — URINALYSIS, ROUTINE W REFLEX MICROSCOPIC
Bilirubin Urine: NEGATIVE
Ketones, ur: NEGATIVE mg/dL
Protein, ur: 100 mg/dL — AB
Urobilinogen, UA: 1 mg/dL (ref 0.0–1.0)

## 2013-06-01 LAB — URINE MICROSCOPIC-ADD ON

## 2013-06-01 MED ORDER — CEPHALEXIN 500 MG PO CAPS: 500.0000 mg | ORAL_CAPSULE | Freq: Four times a day (QID) | ORAL | Status: DC

## 2013-06-01 MED ORDER — ACETAMINOPHEN 325 MG PO TABS
650.0000 mg | ORAL_TABLET | Freq: Once | ORAL | Status: AC
  Administered 2013-06-01: 650 mg via ORAL
  Filled 2013-06-01: qty 2

## 2013-06-01 MED ORDER — ACETAMINOPHEN 500 MG PO TABS
1000.0000 mg | ORAL_TABLET | Freq: Once | ORAL | Status: DC
  Filled 2013-06-01: qty 2

## 2013-06-01 MED ORDER — CEPHALEXIN 500 MG PO CAPS
500.0000 mg | ORAL_CAPSULE | Freq: Once | ORAL | Status: AC
  Administered 2013-06-01: 500 mg via ORAL
  Filled 2013-06-01: qty 1

## 2013-06-01 MED ORDER — SULFAMETHOXAZOLE-TMP DS 800-160 MG PO TABS
1.0000 | ORAL_TABLET | Freq: Once | ORAL | Status: DC
  Filled 2013-06-01: qty 1

## 2013-06-01 NOTE — ED Notes (Signed)
Yellow drainage noted to meatus. Large amount of swelling to base of glans penis. Pt is very pleasant. Denies pain at this time.

## 2013-06-01 NOTE — ED Notes (Signed)
Per H.Neeses, NP pt to have foley balloon deflated and foley inserted further.  Done without difficulty. Pt complain of swelling around penis but not when foley advanced

## 2013-06-01 NOTE — ED Provider Notes (Signed)
Patient had prostate surgery done 5 days ago. He reports minimal bloody drainage from his Foley catheter. He relates during the night he started having pain in his penis.  Patient is uncircumcised. He has swelling of the glans penis with some Prelone drainage around the catheter coming from the urethra. The urine in his catheter is not bloody and is mostly clear. When I visualized around the head of his penis there is some mild constriction where the foreskin joins the penis. The glans of the penis does not look infected, there is no redness, drainage, scaliness.  Medical screening examination/treatment/procedure(s) were conducted as a shared visit with non-physician practitioner(s) and myself.  I personally evaluated the patient during the encounter  Devoria Albe, MD, Franz Dell, MD 06/01/13 [phone]

## 2013-06-01 NOTE — ED Provider Notes (Signed)
CSN: [id number]     Arrival date & time 06/01/13  7829 History   First MD Initiated Contact with Patient 06/01/13 [phone]     Chief Complaint  Patient presents with  . Groin Swelling  . Hematuria   (Consider location/radiation/quality/duration/timing/severity/associated sxs/prior Treatment) Patient is a 79 y.o. male presenting with hematuria. The history is provided by the patient.  Hematuria This is a new problem. The current episode started today. The problem has been gradually improving. Pertinent negatives include no abdominal pain, anorexia, chest pain, chills, coughing, fever, headaches, nausea, rash, sore throat or vomiting.   John Schmidt Sr. is a 79 y.o. male who presents to the ED with pain in his penis and blood in his urine. States he woke about 4 am and that is when the symptoms started. He felt a pop in the area of the foley catheter. Hx of transurethral resection of prostate 05/27/2013. Patient had done well until this morning. Not scheduled to follow up for 2 weeks. Patient lives at home with his wife. His son comes by almost every day to check on them.   Past Medical History  Diagnosis Date  . GERD (gastroesophageal reflux disease)   . COPD (chronic obstructive pulmonary disease)   . Peripheral vascular disease   . Hypertension   . Coronary artery disease   . Gout   . Arthritis     Rheumatoid   Past Surgical History  Procedure Laterality Date  . Hernia repair  1960's  . Poor historian      Patient poor historian on surgical procedures;spoke to Dr. Francee Nodal office concerning medical history.  . Cataract extraction w/phaco Left 04/28/2013    Procedure: CATARACT EXTRACTION PHACO AND INTRAOCULAR LENS PLACEMENT (IOC);  Surgeon: Gemma Payor, MD;  Location: [facility];  Service: Ophthalmology;  Laterality: Left;  CDE:21.0  . Transurethral resection of prostate N/A 05/27/2013    Procedure: TRANSURETHRAL RESECTION OF THE PROSTATE (TURP);  Surgeon: Ky Barban, MD;  Location:  [facility];  Service: Urology;  Laterality: N/A;   Family History  Problem Relation Age of Onset  . Cancer Mother   . Cancer Father   . Cancer Sister   . Cancer Brother   . Cancer Brother   . Cancer Brother   . Cancer Sister   . Cancer Brother   . Cancer Brother    History  Substance Use Topics  . Smoking status: Current Every Day Smoker -- 1.00 packs/day for 20 years    Types: Cigars  . Smokeless tobacco: Not on file  . Alcohol Use: No    Review of Systems  Constitutional: Negative for fever and chills.  HENT: Negative for sore throat.   Respiratory: Negative for cough and shortness of breath.   Cardiovascular: Negative for chest pain.  Gastrointestinal: Negative for nausea, vomiting, abdominal pain and anorexia.  Genitourinary: Positive for hematuria and penile pain.  Skin: Negative for rash.  Neurological: Negative for headaches.  Psychiatric/Behavioral: The patient is not nervous/anxious.     Allergies  Doxycycline  Home Medications   Current Outpatient Rx  Name  Route  Sig  Dispense  Refill  . albuterol (PROVENTIL HFA;VENTOLIN HFA) 108 (90 BASE) MCG/ACT inhaler   Inhalation   Inhale 2 puffs into the lungs every 6 (six) hours as needed for wheezing.         Marland Kitchen aspirin 81 MG chewable tablet   Oral   Chew 1 tablet (81 mg total) by mouth daily.   30 tablet  1   . cyclobenzaprine (FLEXERIL) 10 MG tablet   Oral   Take 1 tablet (10 mg total) by mouth 3 (three) times daily as needed for muscle spasms.   30 tablet   0   . diltiazem (TIAZAC) 120 MG 24 hr capsule   Oral   Take 120 mg by mouth daily.         . folic acid (FOLVITE) 1 MG tablet   Oral   Take 1 mg by mouth daily.         Marland Kitchen HYDROcodone-acetaminophen (NORCO) 10-325 MG per tablet   Oral   Take 1 tablet by mouth every 6 (six) hours as needed for pain.         Marland Kitchen ipratropium-albuterol (DUONEB) 0.5-2.5 (3) MG/3ML SOLN   Nebulization   Take 3 mLs by nebulization 2 (two) times daily.           . Linaclotide (LINZESS) 290 MCG CAPS   Oral   Take 290 capsules by mouth daily.         . methotrexate 25 MG/ML SOLN   Oral   Take 15 mg by mouth once a week.         Marland Kitchen omeprazole (PRILOSEC) 20 MG capsule   Oral   Take 20 mg by mouth daily.         . tamsulosin (FLOMAX) 0.4 MG CAPS capsule   Oral   Take 0.4 mg by mouth daily after supper.          BP 148/74  Pulse 76  Temp(Src) 97.9 F (36.6 C) (Oral)  Resp 16  SpO2 99% Physical Exam  Nursing note and vitals reviewed. Constitutional: He is oriented to person, place, and time. He appears well-developed and well-nourished.  HENT:  Head: Normocephalic.  Eyes: EOM are normal.  Neck: Neck supple.  Cardiovascular: Normal rate and regular rhythm.   Pulmonary/Chest: Effort normal.  Abdominal: Soft. Bowel sounds are normal. There is no tenderness.  Genitourinary: Testes normal.    Uncircumcised. Penile tenderness present.  There is edema of the shaft and head of the penis. There is tenderness. There is purulent drainage noted at the urethral opening around the foley catheter. There is blood noted in the catheter bag.    Musculoskeletal: Normal range of motion.  Neurological: He is alert and oriented to person, place, and time. No cranial nerve deficit.  Skin: Skin is warm and dry.  Psychiatric: He has a normal mood and affect. His behavior is normal.   Results for orders placed during the hospital encounter of 06/01/13 (from the past 24 hour(s))  URINALYSIS, ROUTINE W REFLEX MICROSCOPIC     Status: Abnormal   Collection Time    06/01/13  8:39 AM      Result Value Range   Color, Urine YELLOW  YELLOW   APPearance HAZY (*) CLEAR   Specific Gravity, Urine 1.025  1.005 - 1.030   pH 6.0  5.0 - 8.0   Glucose, UA NEGATIVE  NEGATIVE mg/dL   Hgb urine dipstick LARGE (*) NEGATIVE   Bilirubin Urine NEGATIVE  NEGATIVE   Ketones, ur NEGATIVE  NEGATIVE mg/dL   Protein, ur 956 (*) NEGATIVE mg/dL   Urobilinogen, UA 1.0  0.0 -  1.0 mg/dL   Nitrite NEGATIVE  NEGATIVE   Leukocytes, UA SMALL (*) NEGATIVE  URINE MICROSCOPIC-ADD ON     Status: Abnormal   Collection Time    06/01/13  8:39 AM      Result Value  Range   Squamous Epithelial / LPF RARE  RARE   WBC, UA 11-20  <3 WBC/hpf   RBC / HPF TOO NUMEROUS TO COUNT  <3 RBC/hpf   Bacteria, UA FEW (*) RARE  GLUCOSE, CAPILLARY     Status: None   Collection Time    06/01/13  9:50 AM      Result Value Range   Glucose-Capillary 74  70 - 99 mg/dL    ED Course: Dr. Frann Rider is out of town. I spoke with Dr. Marlou Porch the on call Urologist in [place] and he gave instructions to deflate catheter balloon and gently insert a little further to assure it is in the bladder. RN did the procedure without difficulty. Patient states feels better.  Pressure applied to the area of edema of the penis and swelling has decreased significantly.     Procedures  MDM  79 y.o. male with swelling and pain of the penis s/p TURP 05/29/2013. Concern for Paraphimosis. Dr. Marlou Porch, urology, suggested Bactrim for the patient to go home with. On discharge instructions the Bactrim came up as contraindicated because the patient is on Methotrexate. Will give Keflex instead and patient will see Dr. Jerre Simon in the office tomorrow. He will return here for worsening symptoms.    I have reviewed this patient's vital signs, nurses notes, appropriate labs and discussed findings and plan of care with the patient. He voices understanding.    Medication List    TAKE these medications       cephALEXin 500 MG capsule  Commonly known as:  KEFLEX  Take 1 capsule (500 mg total) by mouth 4 (four) times daily.      ASK your doctor about these medications       albuterol 108 (90 BASE) MCG/ACT inhaler  Commonly known as:  PROVENTIL HFA;VENTOLIN HFA  Inhale 2 puffs into the lungs every 6 (six) hours as needed for wheezing.     aspirin 81 MG chewable tablet  Chew 1 tablet (81 mg total) by mouth daily.      cyclobenzaprine 10 MG tablet  Commonly known as:  FLEXERIL  Take 1 tablet (10 mg total) by mouth 3 (three) times daily as needed for muscle spasms.     diltiazem 120 MG 24 hr capsule  Commonly known as:  TIAZAC  Take 120 mg by mouth daily.     folic acid 1 MG tablet  Commonly known as:  FOLVITE  Take 1 mg by mouth daily.     HYDROcodone-acetaminophen 10-325 MG per tablet  Commonly known as:  NORCO  Take 1 tablet by mouth every 6 (six) hours as needed for pain.     ipratropium-albuterol 0.5-2.5 (3) MG/3ML Soln  Commonly known as:  DUONEB  Take 3 mLs by nebulization 2 (two) times daily.     LINZESS 290 MCG Caps capsule  Generic drug:  Linaclotide  Take 290 capsules by mouth daily.     methotrexate 25 MG/ML Soln  Take 15 mg by mouth once a week.     omeprazole 20 MG capsule  Commonly known as:  PRILOSEC  Take 20 mg by mouth daily.     tamsulosin 0.4 MG Caps capsule  Commonly known as:  FLOMAX  Take 0.4 mg by mouth daily after supper.         [facility] Orlene Och, [place] 06/02/13 1756

## 2013-06-01 NOTE — ED Notes (Signed)
Pt is currently eating. After eating, cab will be notified.

## 2013-06-01 NOTE — ED Notes (Signed)
Pt states bladder surgery on 05/27/13. Felt a pop this morning (also has foley in place) and noticed blood in his urine along with Pain and swelling to penis

## 2013-06-01 NOTE — ED Notes (Signed)
Pt has no way home. Pts son does not get off work until 1800. Pt to be provided with cab voucher for ride home. Meal tray ordered for pt.

## 2013-06-02 NOTE — ED Provider Notes (Signed)
See prior note   Ward Givens, MD 06/02/13 [phone]

## 2013-06-03 ENCOUNTER — Emergency Department (HOSPITAL_COMMUNITY)
Admission: EM | Admit: 2013-06-03 | Discharge: 2013-06-03 | Disposition: A | Discharge status: Home / Self Care | Payer: PRIVATE HEALTH INSURANCE | Attending: Emergency Medicine | Admitting: Emergency Medicine

## 2013-06-03 ENCOUNTER — Encounter (HOSPITAL_COMMUNITY): Payer: Self-pay | Admitting: *Deleted

## 2013-06-03 DIAGNOSIS — N478 Other disorders of prepuce: Secondary | ICD-10-CM | POA: Insufficient documentation

## 2013-06-03 DIAGNOSIS — F17200 Nicotine dependence, unspecified, uncomplicated: Secondary | ICD-10-CM | POA: Insufficient documentation

## 2013-06-03 DIAGNOSIS — I2510 Atherosclerotic heart disease of native coronary artery without angina pectoris: Secondary | ICD-10-CM | POA: Insufficient documentation

## 2013-06-03 DIAGNOSIS — Z7982 Long term (current) use of aspirin: Secondary | ICD-10-CM | POA: Insufficient documentation

## 2013-06-03 DIAGNOSIS — Y846 Urinary catheterization as the cause of abnormal reaction of the patient, or of later complication, without mention of misadventure at the time of the procedure: Secondary | ICD-10-CM | POA: Insufficient documentation

## 2013-06-03 DIAGNOSIS — Z8639 Personal history of other endocrine, nutritional and metabolic disease: Secondary | ICD-10-CM | POA: Insufficient documentation

## 2013-06-03 DIAGNOSIS — Z8669 Personal history of other diseases of the nervous system and sense organs: Secondary | ICD-10-CM | POA: Insufficient documentation

## 2013-06-03 DIAGNOSIS — J449 Chronic obstructive pulmonary disease, unspecified: Secondary | ICD-10-CM | POA: Insufficient documentation

## 2013-06-03 DIAGNOSIS — J4489 Other specified chronic obstructive pulmonary disease: Secondary | ICD-10-CM | POA: Insufficient documentation

## 2013-06-03 DIAGNOSIS — N472 Paraphimosis: Secondary | ICD-10-CM

## 2013-06-03 DIAGNOSIS — K219 Gastro-esophageal reflux disease without esophagitis: Secondary | ICD-10-CM | POA: Insufficient documentation

## 2013-06-03 DIAGNOSIS — Z862 Personal history of diseases of the blood and blood-forming organs and certain disorders involving the immune mechanism: Secondary | ICD-10-CM | POA: Insufficient documentation

## 2013-06-03 DIAGNOSIS — N471 Phimosis: Secondary | ICD-10-CM | POA: Insufficient documentation

## 2013-06-03 DIAGNOSIS — I10 Essential (primary) hypertension: Secondary | ICD-10-CM | POA: Insufficient documentation

## 2013-06-03 DIAGNOSIS — Z79899 Other long term (current) drug therapy: Secondary | ICD-10-CM | POA: Insufficient documentation

## 2013-06-03 DIAGNOSIS — Z792 Long term (current) use of antibiotics: Secondary | ICD-10-CM | POA: Insufficient documentation

## 2013-06-03 LAB — URINE CULTURE
Colony Count: NO GROWTH
Culture: NO GROWTH

## 2013-06-03 NOTE — ED Notes (Signed)
Pt says he is having problem with catheter and swelling in groin Seen here 9/23 for same.  Pt has urine in tubing of cath.

## 2013-06-03 NOTE — ED Notes (Signed)
Patient with no complaints at this time. Respirations even and unlabored. Skin warm/dry. Discharge instructions reviewed with patient at this time. Patient given opportunity to voice concerns/ask questions. Patient discharged at this time and left Emergency Department with steady gait.   

## 2013-06-03 NOTE — ED Notes (Signed)
Patient provided leg bag and new overnight (standard drainage bag) per request. Patient education on how to change leg bag to overnight bag and catheter care reviewed with patient. Verbal understanding obtained.

## 2013-06-03 NOTE — ED Provider Notes (Signed)
CSN: [id number]     Arrival date & time 06/03/13  1255 History  This chart was scribed for Roney Marion, MD by Blanchard Kelch, ED Scribe. The patient was seen in room APA18/AP[facility]18. Patient's care was started at 3:09 PM.     No chief complaint on file.   The history is provided by the patient. No language interpreter was used.    HPI Comments: John Zoll Sr. is a 79 y.o. male who presents to the Emergency Department complaining of constant penile pain where his catheter is placed. He describes the pain as sore. He complains of associated moderate swelling in his penis. He states that the catheter is draining slightly around his groin area and not all of the urine is going into the bag. He complains of abdominal pain. He was previously seen for the same problem in the ED on 9/28 and was discharged.  He denies fever. [place]    Past Medical History  Diagnosis Date  . GERD (gastroesophageal reflux disease)   . COPD (chronic obstructive pulmonary disease)   . Peripheral vascular disease   . Hypertension   . Coronary artery disease   . Gout   . Arthritis     Rheumatoid   Past Surgical History  Procedure Laterality Date  . Hernia repair  1960's  . Poor historian      Patient poor historian on surgical procedures;spoke to Dr. Francee Nodal office concerning medical history.  . Cataract extraction w/phaco Left 04/28/2013    Procedure: CATARACT EXTRACTION PHACO AND INTRAOCULAR LENS PLACEMENT (IOC);  Surgeon: Gemma Payor, MD;  Location: [facility];  Service: Ophthalmology;  Laterality: Left;  CDE:21.0  . Transurethral resection of prostate N/A 05/27/2013    Procedure: TRANSURETHRAL RESECTION OF THE PROSTATE (TURP);  Surgeon: Ky Barban, MD;  Location: [facility];  Service: Urology;  Laterality: N/A;   Family History  Problem Relation Age of Onset  . Cancer Mother   . Cancer Father   . Cancer Sister   . Cancer Brother   . Cancer Brother   . Cancer Brother   .  Cancer Sister   . Cancer Brother   . Cancer Brother    History  Substance Use Topics  . Smoking status: Current Every Day Smoker -- 1.00 packs/day for 20 years    Types: Cigars  . Smokeless tobacco: Not on file  . Alcohol Use: No    Review of Systems  Constitutional: Negative for fever.  Gastrointestinal: Positive for abdominal pain.  Genitourinary: Positive for penile swelling and penile pain.  All other systems reviewed and are negative.    Allergies  Doxycycline  Home Medications   Current Outpatient Rx  Name  Route  Sig  Dispense  Refill  . albuterol (PROVENTIL HFA;VENTOLIN HFA) 108 (90 BASE) MCG/ACT inhaler   Inhalation   Inhale 2 puffs into the lungs every 6 (six) hours as needed for wheezing.         Marland Kitchen aspirin 81 MG chewable tablet   Oral   Chew 1 tablet (81 mg total) by mouth daily.   30 tablet   1   . cephALEXin (KEFLEX) 500 MG capsule   Oral   Take 1 capsule (500 mg total) by mouth 4 (four) times daily.   21 capsule   0   . cyclobenzaprine (FLEXERIL) 10 MG tablet   Oral   Take 1 tablet (10 mg total) by mouth 3 (three) times daily as  needed for muscle spasms.   30 tablet   0   . diltiazem (TIAZAC) 120 MG 24 hr capsule   Oral   Take 120 mg by mouth daily.         . folic acid (FOLVITE) 1 MG tablet   Oral   Take 1 mg by mouth daily.         Marland Kitchen HYDROcodone-acetaminophen (NORCO) 10-325 MG per tablet   Oral   Take 1 tablet by mouth every 6 (six) hours as needed for pain.         Marland Kitchen ipratropium-albuterol (DUONEB) 0.5-2.5 (3) MG/3ML SOLN   Nebulization   Take 3 mLs by nebulization 2 (two) times daily.         . Linaclotide (LINZESS) 290 MCG CAPS   Oral   Take 290 capsules by mouth daily.         . methotrexate 25 MG/ML SOLN   Oral   Take 15 mg by mouth once a week.         Marland Kitchen omeprazole (PRILOSEC) 20 MG capsule   Oral   Take 20 mg by mouth daily.         . tamsulosin (FLOMAX) 0.4 MG CAPS capsule   Oral   Take 0.4 mg by  mouth daily after supper.          Triage Vitals: BP 116/64  Pulse 82  Resp 18  Ht 5\' 4"  (1.626 m)  Wt 132 lb (59.875 kg)  BMI 22.65 kg/m2  SpO2 100%   Physical Exam  Nursing note and vitals reviewed. Genitourinary: Testes normal. Cremasteric reflex is present. Paraphimosis present.  Foreskin swollen. Paraphimosis reduced easily. Good perfusion to glans.     ED Course  Procedures (including critical care time)  DIAGNOSTIC STUDIES: Oxygen Saturation is 100% on room air, normal by my interpretation.    COORDINATION OF CARE:  3:15 PM -reduced paraphimosis. Will re-check patient. Catheter seems to be working well. Patient verbalizes understanding and agrees with treatment plan.   Labs Review Labs Reviewed - No data to display Imaging Review No results found.  MDM   1. Paraphimosis   Re checked after 1 hour.  Paraphimosis stays reduced.  Visualization of glans shows normal perfusion.  Pt states that pain is "gone".   I personally performed the services described in this documentation, which was scribed in my presence. The recorded information has been reviewed and is accurate.    Roney Marion, MD 06/03/13 [phone]

## 2013-06-03 NOTE — ED Notes (Signed)
MD at bedside. 

## 2014-08-25 ENCOUNTER — Ambulatory Visit (INDEPENDENT_AMBULATORY_CARE_PROVIDER_SITE_OTHER): Payer: PRIVATE HEALTH INSURANCE | Admitting: Urology

## 2014-08-25 DIAGNOSIS — C61 Malignant neoplasm of prostate: Secondary | ICD-10-CM | POA: Diagnosis not present

## 2014-08-25 DIAGNOSIS — R312 Other microscopic hematuria: Secondary | ICD-10-CM

## 2014-08-26 ENCOUNTER — Other Ambulatory Visit: Payer: Self-pay | Admitting: Urology

## 2014-08-26 DIAGNOSIS — C61 Malignant neoplasm of prostate: Secondary | ICD-10-CM

## 2014-09-02 ENCOUNTER — Encounter (HOSPITAL_COMMUNITY): Payer: Self-pay

## 2014-09-02 ENCOUNTER — Encounter (HOSPITAL_COMMUNITY)
Admission: RE | Admit: 2014-09-02 | Discharge: 2014-09-02 | Disposition: A | Discharge status: Home / Self Care | Payer: PRIVATE HEALTH INSURANCE | Source: Ambulatory Visit | Attending: Urology | Admitting: Urology

## 2014-09-02 ENCOUNTER — Ambulatory Visit (HOSPITAL_COMMUNITY)
Admission: RE | Admit: 2014-09-02 | Discharge: 2014-09-02 | Disposition: A | Discharge status: Home / Self Care | Payer: PRIVATE HEALTH INSURANCE | Source: Ambulatory Visit | Attending: Urology | Admitting: Urology

## 2014-09-02 DIAGNOSIS — I723 Aneurysm of iliac artery: Secondary | ICD-10-CM | POA: Diagnosis not present

## 2014-09-02 DIAGNOSIS — M419 Scoliosis, unspecified: Secondary | ICD-10-CM | POA: Diagnosis not present

## 2014-09-02 DIAGNOSIS — C61 Malignant neoplasm of prostate: Secondary | ICD-10-CM | POA: Insufficient documentation

## 2014-09-02 DIAGNOSIS — M5136 Other intervertebral disc degeneration, lumbar region: Secondary | ICD-10-CM | POA: Diagnosis not present

## 2014-09-02 DIAGNOSIS — I700 Atherosclerosis of aorta: Secondary | ICD-10-CM | POA: Insufficient documentation

## 2014-09-02 HISTORY — DX: Malignant (primary) neoplasm, unspecified: C80.1

## 2014-09-02 MED ORDER — IOHEXOL 300 MG/ML  SOLN
80.0000 mL | Freq: Once | INTRAMUSCULAR | Status: AC | PRN
  Administered 2014-09-02: 80 mL via INTRAVENOUS

## 2014-09-02 MED ORDER — TECHNETIUM TC 99M MEDRONATE IV KIT
25.0000 | PACK | Freq: Once | INTRAVENOUS | Status: AC | PRN
  Administered 2014-09-02: 25 via INTRAVENOUS

## 2014-09-22 ENCOUNTER — Ambulatory Visit: Payer: Medicaid Other | Admitting: Urology

## 2014-10-14 ENCOUNTER — Emergency Department (HOSPITAL_COMMUNITY): Payer: Medicare Other

## 2014-10-14 ENCOUNTER — Emergency Department (HOSPITAL_COMMUNITY)
Admission: EM | Admit: 2014-10-14 | Discharge: 2014-10-14 | Disposition: A | Discharge status: Home / Self Care | Payer: Medicare Other | Attending: Emergency Medicine | Admitting: Emergency Medicine

## 2014-10-14 ENCOUNTER — Encounter (HOSPITAL_COMMUNITY): Payer: Self-pay

## 2014-10-14 DIAGNOSIS — Z7982 Long term (current) use of aspirin: Secondary | ICD-10-CM | POA: Diagnosis not present

## 2014-10-14 DIAGNOSIS — M069 Rheumatoid arthritis, unspecified: Secondary | ICD-10-CM | POA: Diagnosis not present

## 2014-10-14 DIAGNOSIS — Z720 Tobacco use: Secondary | ICD-10-CM | POA: Insufficient documentation

## 2014-10-14 DIAGNOSIS — Y9289 Other specified places as the place of occurrence of the external cause: Secondary | ICD-10-CM | POA: Insufficient documentation

## 2014-10-14 DIAGNOSIS — K219 Gastro-esophageal reflux disease without esophagitis: Secondary | ICD-10-CM | POA: Diagnosis not present

## 2014-10-14 DIAGNOSIS — Y9389 Activity, other specified: Secondary | ICD-10-CM | POA: Insufficient documentation

## 2014-10-14 DIAGNOSIS — I2510 Atherosclerotic heart disease of native coronary artery without angina pectoris: Secondary | ICD-10-CM | POA: Insufficient documentation

## 2014-10-14 DIAGNOSIS — Z792 Long term (current) use of antibiotics: Secondary | ICD-10-CM | POA: Diagnosis not present

## 2014-10-14 DIAGNOSIS — W01198A Fall on same level from slipping, tripping and stumbling with subsequent striking against other object, initial encounter: Secondary | ICD-10-CM | POA: Diagnosis not present

## 2014-10-14 DIAGNOSIS — S0990XA Unspecified injury of head, initial encounter: Secondary | ICD-10-CM | POA: Diagnosis present

## 2014-10-14 DIAGNOSIS — Z8546 Personal history of malignant neoplasm of prostate: Secondary | ICD-10-CM | POA: Diagnosis not present

## 2014-10-14 DIAGNOSIS — Z79899 Other long term (current) drug therapy: Secondary | ICD-10-CM | POA: Diagnosis not present

## 2014-10-14 DIAGNOSIS — R059 Cough, unspecified: Secondary | ICD-10-CM

## 2014-10-14 DIAGNOSIS — R05 Cough: Secondary | ICD-10-CM | POA: Diagnosis not present

## 2014-10-14 DIAGNOSIS — I10 Essential (primary) hypertension: Secondary | ICD-10-CM | POA: Diagnosis not present

## 2014-10-14 DIAGNOSIS — W19XXXA Unspecified fall, initial encounter: Secondary | ICD-10-CM

## 2014-10-14 DIAGNOSIS — Y998 Other external cause status: Secondary | ICD-10-CM | POA: Diagnosis not present

## 2014-10-14 DIAGNOSIS — J449 Chronic obstructive pulmonary disease, unspecified: Secondary | ICD-10-CM | POA: Diagnosis not present

## 2014-10-14 LAB — CBC WITH DIFFERENTIAL/PLATELET
Basophils Absolute: 0 10*3/uL (ref 0.0–0.1)
Basophils Relative: 1 % (ref 0–1)
Eosinophils Absolute: 0.1 10*3/uL (ref 0.0–0.7)
Eosinophils Relative: 2 % (ref 0–5)
HCT: 31.7 % — ABNORMAL LOW (ref 39.0–52.0)
HEMOGLOBIN: 10.1 g/dL — AB (ref 13.0–17.0)
LYMPHS ABS: 0.7 10*3/uL (ref 0.7–4.0)
Lymphocytes Relative: 16 % (ref 12–46)
MCH: 28.9 pg (ref 26.0–34.0)
MCHC: 31.9 g/dL (ref 30.0–36.0)
MCV: 90.6 fL (ref 78.0–100.0)
Monocytes Absolute: 0.3 10*3/uL (ref 0.1–1.0)
Monocytes Relative: 6 % (ref 3–12)
NEUTROS ABS: 3.3 10*3/uL (ref 1.7–7.7)
NEUTROS PCT: 75 % (ref 43–77)
Platelets: 150 10*3/uL (ref 150–400)
RBC: 3.5 MIL/uL — AB (ref 4.22–5.81)
RDW: 14.5 % (ref 11.5–15.5)
WBC: 4.4 10*3/uL (ref 4.0–10.5)

## 2014-10-14 LAB — BASIC METABOLIC PANEL
Anion gap: 11 (ref 5–15)
BUN: 20 mg/dL (ref 6–23)
CO2: 20 mmol/L (ref 19–32)
CREATININE: 1.28 mg/dL (ref 0.50–1.35)
Calcium: 8.7 mg/dL (ref 8.4–10.5)
Chloride: 108 mmol/L (ref 96–112)
GFR calc Af Amer: 56 mL/min — ABNORMAL LOW (ref >90)
GFR, EST NON AFRICAN AMERICAN: 48 mL/min — AB (ref >90)
GLUCOSE: 81 mg/dL (ref 70–99)
POTASSIUM: 4.1 mmol/L (ref 3.5–5.1)
Sodium: 139 mmol/L (ref 135–145)

## 2014-10-14 LAB — I-STAT TROPONIN, ED: TROPONIN I, POC: 0.01 ng/mL (ref 0.00–0.08)

## 2014-10-14 NOTE — ED Provider Notes (Signed)
Patient presented to the ER with headache after a fall. Patient reportedly fell 4 days ago. He has been having a persistent headache since the fall.  Face to face Exam: HEENT - PERRLA Lungs - CTAB Heart - RRR, no M/R/G Abd - S/NT/ND Neuro - alert, confused  Plan: Imaging to rule out head injury. Blood Work has been unremarkable. Anticipate discharge if CT head shows no evidence of injury.   Orpah Greek, MD 10/14/14 1434

## 2014-10-14 NOTE — ED Notes (Signed)
Urine sample at the bedside if needed

## 2014-10-14 NOTE — ED Notes (Signed)
Pt from home with fall on Saturday.  Sts his legs gave out and he fell and hit his head against a commode.  Reporting a headache since fall.  Went to PCP this morning and was told to come to ED for further evaluation and CT scan.

## 2014-10-14 NOTE — ED Provider Notes (Signed)
CSN: [id number]     Arrival date & time 10/14/14  1213 History   First MD Initiated Contact with Patient 10/14/14 1220     Chief Complaint  Patient presents with  . Fall  . Head Injury     (Consider location/radiation/quality/duration/timing/severity/associated sxs/prior Treatment) HPI Comments: Patient with past medical history of COPD, hypertension, CAD, presents emergency department with chief complaint of fall.  He states that he fell on Saturday. He reports having a headache since the fall. States that he hit his head on the toilet. He has been having frequent falls for the past several weeks. He states that his legs give out from underneath him. He states that he has bad arthritis which contributes to this. He has not taken anything to alleviate his symptoms. He takes aspirin daily. He does not take any other blood thinners. He denies any pain in his extremities. He denies any chest pain, shortness of breath, or abdominal pain. Denies any slurred speech, numbness, weakness, or tingling.  The history is provided by the patient. No language interpreter was used.    Past Medical History  Diagnosis Date  . GERD (gastroesophageal reflux disease)   . COPD (chronic obstructive pulmonary disease)   . Peripheral vascular disease   . Hypertension   . Coronary artery disease   . Gout   . Arthritis     Rheumatoid  . Cancer     Prostate   Past Surgical History  Procedure Laterality Date  . Hernia repair  1960's  . Poor historian      Patient poor historian on surgical procedures;spoke to Dr. Landis Gandy office concerning medical history.  . Cataract extraction w/phaco Left 04/28/2013    Procedure: CATARACT EXTRACTION PHACO AND INTRAOCULAR LENS PLACEMENT (IOC);  Surgeon: Tonny Branch, MD;  Location: [facility];  Service: Ophthalmology;  Laterality: Left;  CDE:21.0  . Transurethral resection of prostate N/A 05/27/2013    Procedure: TRANSURETHRAL RESECTION OF THE PROSTATE (TURP);  Surgeon:  Marissa Nestle, MD;  Location: [facility];  Service: Urology;  Laterality: N/A;   Family History  Problem Relation Age of Onset  . Cancer Mother   . Cancer Father   . Cancer Sister   . Cancer Brother   . Cancer Brother   . Cancer Brother   . Cancer Sister   . Cancer Brother   . Cancer Brother    History  Substance Use Topics  . Smoking status: Current Every Day Smoker -- 1.00 packs/day for 20 years    Types: Cigars  . Smokeless tobacco: Not on file  . Alcohol Use: No    Review of Systems  Constitutional: Negative for fever and chills.  Respiratory: Negative for shortness of breath.   Cardiovascular: Negative for chest pain.  Gastrointestinal: Negative for nausea, vomiting, diarrhea and constipation.  Genitourinary: Negative for dysuria.  Neurological: Positive for headaches.  All other systems reviewed and are negative.     Allergies  Doxycycline  Home Medications   Prior to Admission medications   Medication Sig Start Date End Date Taking? Authorizing Provider  albuterol (PROVENTIL HFA;VENTOLIN HFA) 108 (90 BASE) MCG/ACT inhaler Inhale 2 puffs into the lungs every 6 (six) hours as needed for wheezing.    Historical Provider, MD  aspirin 81 MG chewable tablet Chew 1 tablet (81 mg total) by mouth daily. 05/28/13   Marissa Nestle, MD  cephALEXin (KEFLEX) 500 MG capsule Take 1 capsule (500 mg total) by mouth 4 (four) times daily. 06/01/13  Hope Bunnie Pion, NP  cyclobenzaprine (FLEXERIL) 10 MG tablet Take 1 tablet (10 mg total) by mouth 3 (three) times daily as needed for muscle spasms. 05/28/13   Marissa Nestle, MD  diltiazem (TIAZAC) 120 MG 24 hr capsule Take 120 mg by mouth daily.    Historical Provider, MD  folic acid (FOLVITE) 1 MG tablet Take 1 mg by mouth daily.    Historical Provider, MD  HYDROcodone-acetaminophen (NORCO) 10-325 MG per tablet Take 1 tablet by mouth every 6 (six) hours as needed for pain.    Historical Provider, MD  ipratropium-albuterol (DUONEB)  0.5-2.5 (3) MG/3ML SOLN Take 3 mLs by nebulization 2 (two) times daily.    Historical Provider, MD  Linaclotide Rolan Lipa) 290 MCG CAPS Take 290 capsules by mouth daily.    Historical Provider, MD  methotrexate 25 MG/ML SOLN Take 15 mg by mouth once a week.    Historical Provider, MD  omeprazole (PRILOSEC) 20 MG capsule Take 20 mg by mouth daily.    Historical Provider, MD  tamsulosin (FLOMAX) 0.4 MG CAPS capsule Take 0.4 mg by mouth daily after supper.    Historical Provider, MD   BP 171/96 mmHg  Pulse 102  Resp 18  SpO2 100% Physical Exam  Constitutional: He is oriented to person, place, and time. He appears well-developed and well-nourished.  HENT:  Head: Normocephalic and atraumatic.  Right Ear: External ear normal.  Left Ear: External ear normal.  Eyes: Conjunctivae and EOM are normal. Pupils are equal, round, and reactive to light.  Neck: Normal range of motion. Neck supple.  No pain with neck flexion, no meningismus  Cardiovascular: Normal rate, regular rhythm and normal heart sounds.  Exam reveals no gallop and no friction rub.   No murmur heard. Pulmonary/Chest: Effort normal and breath sounds normal. No respiratory distress. He has no wheezes. He has no rales. He exhibits no tenderness.  Abdominal: Soft. He exhibits no distension and no mass. There is no tenderness. There is no rebound and no guarding.  Musculoskeletal: Normal range of motion. He exhibits no edema or tenderness.  Normal gait.  Neurological: He is alert and oriented to person, place, and time. He has normal reflexes.  CN 3-12 intact, normal finger to nose, no pronator drift, sensation and strength intact bilaterally.  Skin: Skin is warm and dry.  Psychiatric: He has a normal mood and affect. His behavior is normal. Judgment and thought content normal.  Nursing note and vitals reviewed.   ED Course  Procedures (including critical care time) Results for orders placed or performed during the hospital encounter  of 10/14/14  CBC with Differential/Platelet  Result Value Ref Range   WBC 4.4 4.0 - 10.5 K/uL   RBC 3.50 (L) 4.22 - 5.81 MIL/uL   Hemoglobin 10.1 (L) 13.0 - 17.0 g/dL   HCT 31.7 (L) 39.0 - 52.0 %   MCV 90.6 78.0 - 100.0 fL   MCH 28.9 26.0 - 34.0 pg   MCHC 31.9 30.0 - 36.0 g/dL   RDW 14.5 11.5 - 15.5 %   Platelets 150 150 - 400 K/uL   Neutrophils Relative % 75 43 - 77 %   Neutro Abs 3.3 1.7 - 7.7 K/uL   Lymphocytes Relative 16 12 - 46 %   Lymphs Abs 0.7 0.7 - 4.0 K/uL   Monocytes Relative 6 3 - 12 %   Monocytes Absolute 0.3 0.1 - 1.0 K/uL   Eosinophils Relative 2 0 - 5 %   Eosinophils Absolute 0.1  0.0 - 0.7 K/uL   Basophils Relative 1 0 - 1 %   Basophils Absolute 0.0 0.0 - 0.1 K/uL  Basic metabolic panel  Result Value Ref Range   Sodium 139 135 - 145 mmol/L   Potassium 4.1 3.5 - 5.1 mmol/L   Chloride 108 96 - 112 mmol/L   CO2 20 19 - 32 mmol/L   Glucose, Bld 81 70 - 99 mg/dL   BUN 20 6 - 23 mg/dL   Creatinine, Ser 1.28 0.50 - 1.35 mg/dL   Calcium 8.7 8.4 - 10.5 mg/dL   GFR calc non Af Amer 48 (L) >90 mL/min   GFR calc Af Amer 56 (L) >90 mL/min   Anion gap 11 5 - 15  I-stat troponin, ED  Result Value Ref Range   Troponin i, poc 0.01 0.00 - 0.08 ng/mL   Comment 3           Dg Chest 2 View  10/14/2014   CLINICAL DATA:  Cough, congestion, weakness.  EXAM: CHEST  2 VIEW  COMPARISON:  06/12/2013  FINDINGS: Heart is normal size. Tortuosity of the thoracic aorta. Lungs are clear. No effusions. No acute bony abnormality. Degenerative changes and scoliosis in the thoracic spine.  IMPRESSION: No active cardiopulmonary disease.   Electronically Signed   By: Rolm Baptise M.D.   On: 10/14/2014 14:37   Ct Head Wo Contrast  10/14/2014   CLINICAL DATA:  Fall, head injury. Fall on Saturday. Hit head on toilet.  EXAM: CT HEAD WITHOUT CONTRAST  CT CERVICAL SPINE WITHOUT CONTRAST  TECHNIQUE: Multidetector CT imaging of the head and cervical spine was performed following the standard protocol  without intravenous contrast. Multiplanar CT image reconstructions of the cervical spine were also generated.  COMPARISON:  08/08/2012 PET-CT  FINDINGS: CT HEAD FINDINGS  There is atrophy and chronic small vessel disease changes. No acute intracranial abnormality. Specifically, no hemorrhage, hydrocephalus, mass lesion, acute infarction, or significant intracranial injury. No acute calvarial abnormality.  CT CERVICAL SPINE FINDINGS  Fusion at the C1-2 level. Degenerative disc disease is C3-4. Degenerative facet disease throughout the cervical spine. Slight anterolisthesis of C4 on C5 related to facet disease. Prevertebral soft tissues are normal. No fracture. No epidural or paraspinal hematoma. Scarring in the visualized lung apices.  IMPRESSION: No acute intracranial abnormality.  No acute bony abnormality in the cervical spine. Degenerative changes as above.   Electronically Signed   By: Rolm Baptise M.D.   On: 10/14/2014 14:54   Ct Cervical Spine Wo Contrast  10/14/2014   CLINICAL DATA:  Fall, head injury. Fall on Saturday. Hit head on toilet.  EXAM: CT HEAD WITHOUT CONTRAST  CT CERVICAL SPINE WITHOUT CONTRAST  TECHNIQUE: Multidetector CT imaging of the head and cervical spine was performed following the standard protocol without intravenous contrast. Multiplanar CT image reconstructions of the cervical spine were also generated.  COMPARISON:  08/08/2012 PET-CT  FINDINGS: CT HEAD FINDINGS  There is atrophy and chronic small vessel disease changes. No acute intracranial abnormality. Specifically, no hemorrhage, hydrocephalus, mass lesion, acute infarction, or significant intracranial injury. No acute calvarial abnormality.  CT CERVICAL SPINE FINDINGS  Fusion at the C1-2 level. Degenerative disc disease is C3-4. Degenerative facet disease throughout the cervical spine. Slight anterolisthesis of C4 on C5 related to facet disease. Prevertebral soft tissues are normal. No fracture. No epidural or paraspinal  hematoma. Scarring in the visualized lung apices.  IMPRESSION: No acute intracranial abnormality.  No acute bony abnormality in the cervical spine.  Degenerative changes as above.   Electronically Signed   By: Rolm Baptise M.D.   On: 10/14/2014 14:54     Imaging Review Dg Chest 2 View  10/14/2014   CLINICAL DATA:  Cough, congestion, weakness.  EXAM: CHEST  2 VIEW  COMPARISON:  06/12/2013  FINDINGS: Heart is normal size. Tortuosity of the thoracic aorta. Lungs are clear. No effusions. No acute bony abnormality. Degenerative changes and scoliosis in the thoracic spine.  IMPRESSION: No active cardiopulmonary disease.   Electronically Signed   By: Rolm Baptise M.D.   On: 10/14/2014 14:37   Ct Head Wo Contrast  10/14/2014   CLINICAL DATA:  Fall, head injury. Fall on Saturday. Hit head on toilet.  EXAM: CT HEAD WITHOUT CONTRAST  CT CERVICAL SPINE WITHOUT CONTRAST  TECHNIQUE: Multidetector CT imaging of the head and cervical spine was performed following the standard protocol without intravenous contrast. Multiplanar CT image reconstructions of the cervical spine were also generated.  COMPARISON:  08/08/2012 PET-CT  FINDINGS: CT HEAD FINDINGS  There is atrophy and chronic small vessel disease changes. No acute intracranial abnormality. Specifically, no hemorrhage, hydrocephalus, mass lesion, acute infarction, or significant intracranial injury. No acute calvarial abnormality.  CT CERVICAL SPINE FINDINGS  Fusion at the C1-2 level. Degenerative disc disease is C3-4. Degenerative facet disease throughout the cervical spine. Slight anterolisthesis of C4 on C5 related to facet disease. Prevertebral soft tissues are normal. No fracture. No epidural or paraspinal hematoma. Scarring in the visualized lung apices.  IMPRESSION: No acute intracranial abnormality.  No acute bony abnormality in the cervical spine. Degenerative changes as above.   Electronically Signed   By: Rolm Baptise M.D.   On: 10/14/2014 14:54   Ct  Cervical Spine Wo Contrast  10/14/2014   CLINICAL DATA:  Fall, head injury. Fall on Saturday. Hit head on toilet.  EXAM: CT HEAD WITHOUT CONTRAST  CT CERVICAL SPINE WITHOUT CONTRAST  TECHNIQUE: Multidetector CT imaging of the head and cervical spine was performed following the standard protocol without intravenous contrast. Multiplanar CT image reconstructions of the cervical spine were also generated.  COMPARISON:  08/08/2012 PET-CT  FINDINGS: CT HEAD FINDINGS  There is atrophy and chronic small vessel disease changes. No acute intracranial abnormality. Specifically, no hemorrhage, hydrocephalus, mass lesion, acute infarction, or significant intracranial injury. No acute calvarial abnormality.  CT CERVICAL SPINE FINDINGS  Fusion at the C1-2 level. Degenerative disc disease is C3-4. Degenerative facet disease throughout the cervical spine. Slight anterolisthesis of C4 on C5 related to facet disease. Prevertebral soft tissues are normal. No fracture. No epidural or paraspinal hematoma. Scarring in the visualized lung apices.  IMPRESSION: No acute intracranial abnormality.  No acute bony abnormality in the cervical spine. Degenerative changes as above.   Electronically Signed   By: Rolm Baptise M.D.   On: 10/14/2014 14:54     EKG Interpretation None      MDM   Final diagnoses:  Cough  Fall, initial encounter  Head injury, initial encounter    Patient with mechanical fall on Saturday. He presents to the emergency department with chief complaint of headache and joint pain. No evidence of head trauma. Patient is neurovascularly intact. Images are negative. Patient seen by and discussed with Dr. Betsey Holiday, who agrees with plan. Discharge to home with primary care follow-up.   Montine Circle, PA-C 10/14/14 [place], MD 10/14/14 [phone]

## 2014-10-14 NOTE — Discharge Instructions (Signed)
Fall Prevention and Home Safety Falls cause injuries and can affect all age groups. It is possible to use preventive measures to significantly decrease the likelihood of falls. There are many simple measures which can make your home safer and prevent falls. OUTDOORS  Repair cracks and edges of walkways and driveways.  Remove high doorway thresholds.  Trim shrubbery on the main path into your home.  Have good outside lighting.  Clear walkways of tools, rocks, debris, and clutter.  Check that handrails are not broken and are securely fastened. Both sides of steps should have handrails.  Have leaves, snow, and ice cleared regularly.  Use sand or salt on walkways during winter months.  In the garage, clean up grease or oil spills. BATHROOM  Install night lights.  Install grab bars by the toilet and in the tub and shower.  Use non-skid mats or decals in the tub or shower.  Place a plastic non-slip stool in the shower to sit on, if needed.  Keep floors dry and clean up all water on the floor immediately.  Remove soap buildup in the tub or shower on a regular basis.  Secure bath mats with non-slip, double-sided rug tape.  Remove throw rugs and tripping hazards from the floors. BEDROOMS  Install night lights.  Make sure a bedside light is easy to reach.  Do not use oversized bedding.  Keep a telephone by your bedside.  Have a firm chair with side arms to use for getting dressed.  Remove throw rugs and tripping hazards from the floor. KITCHEN  Keep handles on pots and pans turned toward the center of the stove. Use back burners when possible.  Clean up spills quickly and allow time for drying.  Avoid walking on wet floors.  Avoid hot utensils and knives.  Position shelves so they are not too high or low.  Place commonly used objects within easy reach.  If necessary, use a sturdy step stool with a grab bar when reaching.  Keep electrical cables out of the  way.  Do not use floor polish or wax that makes floors slippery. If you must use wax, use non-skid floor wax.  Remove throw rugs and tripping hazards from the floor. STAIRWAYS  Never leave objects on stairs.  Place handrails on both sides of stairways and use them. Fix any loose handrails. Make sure handrails on both sides of the stairways are as long as the stairs.  Check carpeting to make sure it is firmly attached along stairs. Make repairs to worn or loose carpet promptly.  Avoid placing throw rugs at the top or bottom of stairways, or properly secure the rug with carpet tape to prevent slippage. Get rid of throw rugs, if possible.  Have an electrician put in a light switch at the top and bottom of the stairs. OTHER FALL PREVENTION TIPS  Wear low-heel or rubber-soled shoes that are supportive and fit well. Wear closed toe shoes.  When using a stepladder, make sure it is fully opened and both spreaders are firmly locked. Do not climb a closed stepladder.  Add color or contrast paint or tape to grab bars and handrails in your home. Place contrasting color strips on first and last steps.  Learn and use mobility aids as needed. Install an electrical emergency response system.  Turn on lights to avoid dark areas. Replace light bulbs that burn out immediately. Get light switches that glow.  Arrange furniture to create clear pathways. Keep furniture in the same place.  Firmly attach carpet with non-skid or double-sided tape.  Eliminate uneven floor surfaces.  Select a carpet pattern that does not visually hide the edge of steps.  Be aware of all pets. OTHER HOME SAFETY TIPS  Set the water temperature for 120 F (48.8 C).  Keep emergency numbers on or near the telephone.  Keep smoke detectors on every level of the home and near sleeping areas. Document Released: 08/11/2002 Document Revised: 02/20/2012 Document Reviewed: 11/10/2011 [facility] Patient Information 2015  [place], [place]. This information is not intended to replace advice given to you by your health care provider. Make sure you discuss any questions you have with your health care provider. Head Injury You have received a head injury. It does not appear serious at this time. Headaches and vomiting are common following head injury. It should be easy to awaken from sleeping. Sometimes it is necessary for you to stay in the emergency department for a while for observation. Sometimes admission to the hospital may be needed. After injuries such as yours, most problems occur within the first 24 hours, but side effects may occur up to 7-10 days after the injury. It is important for you to carefully monitor your condition and contact your health care provider or seek immediate medical care if there is a change in your condition. WHAT ARE THE TYPES OF HEAD INJURIES? Head injuries can be as minor as a bump. Some head injuries can be more severe. More severe head injuries include:  A jarring injury to the brain (concussion).  A bruise of the brain (contusion). This mean there is bleeding in the brain that can cause swelling.  A cracked skull (skull fracture).  Bleeding in the brain that collects, clots, and forms a bump (hematoma). WHAT CAUSES A HEAD INJURY? A serious head injury is most likely to happen to someone who is in a car wreck and is not wearing a seat belt. Other causes of major head injuries include bicycle or motorcycle accidents, sports injuries, and falls. HOW ARE HEAD INJURIES DIAGNOSED? A complete history of the event leading to the injury and your current symptoms will be helpful in diagnosing head injuries. Many times, pictures of the brain, such as CT or MRI are needed to see the extent of the injury. Often, an overnight hospital stay is necessary for observation.  WHEN SHOULD I SEEK IMMEDIATE MEDICAL CARE?  You should get help right away if:  You have confusion or drowsiness.  You feel  sick to your stomach (nauseous) or have continued, forceful vomiting.  You have dizziness or unsteadiness that is getting worse.  You have severe, continued headaches not relieved by medicine. Only take over-the-counter or prescription medicines for pain, fever, or discomfort as directed by your health care provider.  You do not have normal function of the arms or legs or are unable to walk.  You notice changes in the black spots in the center of the colored part of your eye (pupil).  You have a clear or bloody fluid coming from your nose or ears.  You have a loss of vision. During the next 24 hours after the injury, you must stay with someone who can watch you for the warning signs. This person should contact local emergency services (911 in the [place] if you have seizures, you become unconscious, or you are unable to wake up. HOW CAN I PREVENT A HEAD INJURY IN THE FUTURE? The most important factor for preventing major head injuries is avoiding motor vehicle accidents.  To minimize the potential for damage to your head, it is crucial to wear seat belts while riding in motor vehicles. Wearing helmets while bike riding and playing collision sports (like football) is also helpful. Also, avoiding dangerous activities around the house will further help reduce your risk of head injury.  WHEN CAN I RETURN TO NORMAL ACTIVITIES AND ATHLETICS? You should be reevaluated by your health care provider before returning to these activities. If you have any of the following symptoms, you should not return to activities or contact sports until 1 week after the symptoms have stopped:  Persistent headache.  Dizziness or vertigo.  Poor attention and concentration.  Confusion.  Memory problems.  Nausea or vomiting.  Fatigue or tire easily.  Irritability.  Intolerant of bright lights or loud noises.  Anxiety or depression.  Disturbed sleep. MAKE SURE YOU:   Understand these instructions.  Will  watch your condition.  Will get help right away if you are not doing well or get worse. Document Released: 08/21/2005 Document Revised: 08/26/2013 Document Reviewed: 04/28/2013 [facility] Patient Information 2015 [place], [place]. This information is not intended to replace advice given to you by your health care provider. Make sure you discuss any questions you have with your health care provider.

## 2014-10-14 NOTE — ED Notes (Signed)
Pt reports he takes aspirin daily. No other blood thinners noted. Son at bedside and reports pt is at baseline

## 2014-10-27 ENCOUNTER — Ambulatory Visit: Payer: Medicare Other | Admitting: Urology

## 2014-11-24 ENCOUNTER — Ambulatory Visit: Payer: Medicare Other | Admitting: Urology

## 2015-01-05 ENCOUNTER — Ambulatory Visit: Payer: Medicare Other | Admitting: Urology

## 2015-03-02 ENCOUNTER — Ambulatory Visit: Payer: Medicare Other | Admitting: Urology

## 2015-05-25 ENCOUNTER — Ambulatory Visit (INDEPENDENT_AMBULATORY_CARE_PROVIDER_SITE_OTHER): Payer: Medicare Other | Admitting: Urology

## 2015-05-25 DIAGNOSIS — C61 Malignant neoplasm of prostate: Secondary | ICD-10-CM

## 2015-05-25 DIAGNOSIS — N390 Urinary tract infection, site not specified: Secondary | ICD-10-CM | POA: Diagnosis not present

## 2015-07-06 ENCOUNTER — Ambulatory Visit (INDEPENDENT_AMBULATORY_CARE_PROVIDER_SITE_OTHER): Payer: Medicare Other | Admitting: Urology

## 2015-07-06 DIAGNOSIS — N390 Urinary tract infection, site not specified: Secondary | ICD-10-CM | POA: Diagnosis not present

## 2015-07-06 DIAGNOSIS — C61 Malignant neoplasm of prostate: Secondary | ICD-10-CM

## 2015-07-06 DIAGNOSIS — N320 Bladder-neck obstruction: Secondary | ICD-10-CM | POA: Diagnosis not present

## 2015-10-19 ENCOUNTER — Ambulatory Visit (INDEPENDENT_AMBULATORY_CARE_PROVIDER_SITE_OTHER): Payer: Medicare Other | Admitting: Orthopaedic Surgery

## 2015-10-19 ENCOUNTER — Encounter: Payer: Self-pay | Admitting: Orthopaedic Surgery

## 2015-10-19 VITALS — BP 86/54 | HR 89 | Temp 97.3°F

## 2015-10-19 DIAGNOSIS — M05762 Rheumatoid arthritis with rheumatoid factor of left knee without organ or systems involvement: Secondary | ICD-10-CM | POA: Diagnosis not present

## 2015-10-19 DIAGNOSIS — S91109A Unspecified open wound of unspecified toe(s) without damage to nail, initial encounter: Secondary | ICD-10-CM

## 2015-10-19 DIAGNOSIS — L089 Local infection of the skin and subcutaneous tissue, unspecified: Secondary | ICD-10-CM | POA: Diagnosis not present

## 2015-10-19 DIAGNOSIS — M05761 Rheumatoid arthritis with rheumatoid factor of right knee without organ or systems involvement: Secondary | ICD-10-CM

## 2015-10-19 NOTE — Patient Instructions (Addendum)
Work on straightening right leg Referral to general surgeon Dr. Arnoldo Morale for right toe Dress wound twice a day use moist to dry saline soaks.

## 2015-10-19 NOTE — Progress Notes (Signed)
Patient AV:[id number] John Schmidt., male DOB:August 31, 1924, 82 y.o. [id number]  Chief Complaint  Patient presents with  . Knee Pain    Right knee pain    HPI  John Pock Sr. is a 82 y.o. male who has long standing rheumatoid arthritis of multiple extremities, more in the knees.  He has no trauma.  He is a resident in a skilled nursing home. He is taking his medicine.  While here I noted he has staining of his socks on the right foot and asked about that.  He said he has a wound of the second toe.  I removed the sock and noted a significant open wound and questionable viability of the second toe and even of the foot.  He denies any trama, fever or chills.  HPI  There is no weight on file to calculate BMI.  Review of Systems  Patient does not have Diabetes Mellitus. Patient has hypertension. Patient has COPD or shortness of breath. Patient does not have BMI > 35. Patient has current smoking history.  Review of Systems  Respiratory: Positive for shortness of breath.   Cardiovascular:       Hypertension  Gastrointestinal: Positive for constipation.       GERD  Musculoskeletal: Positive for joint swelling (right knee pain).       Rheumatoid Arthritis    Past Medical History  Diagnosis Date  . GERD (gastroesophageal reflux disease)   . COPD (chronic obstructive pulmonary disease) ([place])   . Peripheral vascular disease ([place])   . Hypertension   . Coronary artery disease   . Gout   . Arthritis     Rheumatoid  . Cancer [facility])     Prostate    Past Surgical History  Procedure Laterality Date  . Hernia repair  1960's  . Poor historian      Patient poor historian on surgical procedures;spoke to Dr. Landis Gandy office concerning medical history.  . Cataract extraction w/phaco Left 04/28/2013    Procedure: CATARACT EXTRACTION PHACO AND INTRAOCULAR LENS PLACEMENT (IOC);  Surgeon: Tonny Branch, MD;  Location: [facility];  Service: Ophthalmology;  Laterality: Left;  CDE:21.0  . Transurethral  resection of prostate N/A 05/27/2013    Procedure: TRANSURETHRAL RESECTION OF THE PROSTATE (TURP);  Surgeon: Marissa Nestle, MD;  Location: [facility];  Service: Urology;  Laterality: N/A;    Family History  Problem Relation Age of Onset  . Cancer Mother   . Cancer Father   . Cancer Sister   . Cancer Brother   . Cancer Brother   . Cancer Brother   . Cancer Sister   . Cancer Brother   . Cancer Brother     Social History Social History  Substance Use Topics  . Smoking status: Current Every Day Smoker -- 1.00 packs/day for 20 years    Types: Cigars  . Smokeless tobacco: None  . Alcohol Use: No    Allergies  Allergen Reactions  . Doxycycline Nausea And Vomiting    Current Outpatient Prescriptions  Medication Sig Dispense Refill  . albuterol (PROVENTIL HFA;VENTOLIN HFA) 108 (90 BASE) MCG/ACT inhaler Inhale 2 puffs into the lungs every 6 (six) hours as needed for wheezing.    Marland Kitchen aspirin 81 MG chewable tablet Chew 1 tablet (81 mg total) by mouth daily. 30 tablet 1  . cephALEXin (KEFLEX) 500 MG capsule Take 1 capsule (500 mg total) by mouth 4 (four) times daily. 21 capsule 0  . clopidogrel (PLAVIX) 75 MG tablet Take 75 mg by  mouth daily.    . cyclobenzaprine (FLEXERIL) 10 MG tablet Take 1 tablet (10 mg total) by mouth 3 (three) times daily as needed for muscle spasms. 30 tablet 0  . diltiazem (TIAZAC) 120 MG 24 hr capsule Take 120 mg by mouth daily.    Marland Kitchen docusate sodium (COLACE) 100 MG capsule Take 100 mg by mouth 2 (two) times daily.    . ferrous sulfate 325 (65 FE) MG tablet Take 325 mg by mouth daily with breakfast.    . folic acid (FOLVITE) 1 MG tablet Take 1 mg by mouth daily.    Marland Kitchen HYDROcodone-acetaminophen (NORCO) 10-325 MG per tablet Take 1 tablet by mouth every 6 (six) hours as needed for pain.    Marland Kitchen ipratropium-albuterol (DUONEB) 0.5-2.5 (3) MG/3ML SOLN Take 3 mLs by nebulization 2 (two) times daily.    . isosorbide mononitrate (IMDUR) 30 MG 24 hr tablet Take 30 mg by mouth  daily.    Marland Kitchen levothyroxine (SYNTHROID, LEVOTHROID) 50 MCG tablet Take 50 mcg by mouth daily before breakfast.    . Linaclotide (LINZESS) 290 MCG CAPS Take 290 capsules by mouth daily.    Marland Kitchen loratadine (CLARITIN) 10 MG tablet Take 10 mg by mouth daily.    . meloxicam (MOBIC) 7.5 MG tablet Take 7.5 mg by mouth daily.    . methotrexate 25 MG/ML SOLN Take 15 mg by mouth once a week.    . mirtazapine (REMERON) 15 MG tablet Take 15 mg by mouth at bedtime.    Marland Kitchen omeprazole (PRILOSEC) 20 MG capsule Take 20 mg by mouth daily.    . pantoprazole (PROTONIX) 20 MG tablet Take 20 mg by mouth daily.    . tamsulosin (FLOMAX) 0.4 MG CAPS capsule Take 0.4 mg by mouth daily after supper.    . vitamin C (ASCORBIC ACID) 500 MG tablet Take 500 mg by mouth daily.    . Vitamin D, Ergocalciferol, (DRISDOL) 50000 units CAPS capsule Take 50,000 Units by mouth every 7 (seven) days.     No current facility-administered medications for this visit.     Physical Exam  Blood pressure 86/54, pulse 89, temperature 97.3 F (36.3 C).  Constitutional: overall normal hygiene, normal nutrition, well developed, normal grooming, normal body habitus. Assistive device:wheelchair  Musculoskeletal: gait and station Limp confined to wheelchair, muscle tone and strength are normal, no tremors or atrophy is present.  .  Neurological: coordination overall normal.  Deep tendon reflex/nerve stretch intact.  Sensation normal.  Cranial nerves II-XII intact.   Skin: changes to the second toe with open draining wound.  Color of second toe dark, viability is questionable, skin very shinny of the great toe, and fore foot indication poor circulation. No psoriasis.  Psychiatric: Alert and oriented x 3.  Recent memory intact, remote memory unclear.  Normal mood and affect. Well groomed.  Good eye contact.  Cardiovascular: overall no swelling, no varicosities, no edema bilaterally, but second toe has darkness color but not yet cyonosis but very  early perhaps, decreased pulse significantly to right foot.  Lymphatic: palpation is normal.   Extremities:His right knee has deformity and inability to extend past 45.  Stays mostly flexed.  Has no effusion.  Left knee can be extended fully but tender.  He is confined to wheelchair.  Inspection second toe with exposed extensor tendon dorsally and open wound from DIP to the MPJ, darkness of color, no true cyonosis,and it is draining but has no odor.  Skin is very shinny.  Poor circulation to  the foot. Strength and tone he is weak, 4 of 5 but that is his normal state Range of motion right knee 45 to 95 with crepitus, deformity; left knee 0 to 90 with crepitus.  Additional services performed: needs to be seen by general surgeon asap as his appearance of the foot and overall viability is suspected.  Appointment to be made.  Toe cleansed and dressed.  PLAN Call if any problems.  Precautions discussed.  Continue current medications.  Note to nursing home given with instructions for wound care of the toe.  He may lose his foot.  Needs prompt evaluation by Dr. Arnoldo Morale.  Forms completed for nursing home.  Return to clinic 6 weeks.

## 2015-10-21 ENCOUNTER — Telehealth: Payer: Self-pay | Admitting: *Deleted

## 2015-10-21 NOTE — Telephone Encounter (Addendum)
Referral and office notes faxed to Dr. Arnoldo Morale and marked as Urgent. Awaiting appointment. Appointment scheduled with DR. Arnoldo Morale for 10/26/15 at 10:00 am. Janace Hoard, nurse at [facility] was made aware of appointment. Attempted several times to contact grandson, and left message for him to return my call.

## 2015-10-25 ENCOUNTER — Encounter: Payer: Self-pay | Admitting: *Deleted

## 2015-10-25 NOTE — Telephone Encounter (Signed)
This encounter was created in error - please disregard.

## 2015-10-26 ENCOUNTER — Encounter: Payer: Self-pay | Admitting: Vascular Surgery

## 2015-10-28 ENCOUNTER — Encounter: Payer: Self-pay | Admitting: Vascular Surgery

## 2015-10-28 ENCOUNTER — Ambulatory Visit (INDEPENDENT_AMBULATORY_CARE_PROVIDER_SITE_OTHER): Payer: Medicare Other | Admitting: Vascular Surgery

## 2015-10-28 VITALS — BP 94/55 | HR 76 | Temp 97.0°F | Ht 64.0 in | Wt 145.0 lb

## 2015-10-28 DIAGNOSIS — I998 Other disorder of circulatory system: Secondary | ICD-10-CM

## 2015-10-28 DIAGNOSIS — I70229 Atherosclerosis of native arteries of extremities with rest pain, unspecified extremity: Secondary | ICD-10-CM

## 2015-10-28 NOTE — Progress Notes (Signed)
Vascular and Vein Specialist of [place]  Patient name: John Maio Sr. MRN: [id number] DOB: 05/20/1924 Sex: male  REASON FOR CONSULT: Non-healing right second toe ulcer.  HPI: John Weigandt Sr. is a 82 y.o. male, who is seen today for evaluation of nonhealing right second toe ulcer. He is here today with his grandson who cares for him. He is a resident at the [facility] facility. His grandson reports that this ulceration over his toes when present for proximally 6 months. He has had various debridement and local wound care to this. I continues to have issues with nonhealing. He has severe arthritis with severe rotatory deformity of both hands and both lower extremities. He is unable to straighten his right knee past approximately 45. He reports that he can stand to transfer this seems doubtful in his current state. His grandson reports that he has had significant deterioration in his overall status over the past several months. He is somewhat hard of hearing but does seem to understand our discussion. Reports significant pain in his right foot reports this is worse at night.  Past Medical History  Diagnosis Date  . GERD (gastroesophageal reflux disease)   . COPD (chronic obstructive pulmonary disease) ([place])   . Peripheral vascular disease ([place])   . Hypertension   . Coronary artery disease   . Gout   . Arthritis     Rheumatoid  . Cancer [facility])     Prostate    Family History  Problem Relation Age of Onset  . Cancer Mother   . Heart disease Mother     before age 33  . Cancer Father   . Cancer Sister   . Heart disease Sister     before age 70  . Cancer Brother   . Heart disease Brother     before at 3  . Cancer Brother   . Cancer Brother   . Cancer Sister   . Cancer Brother   . Cancer Brother     SOCIAL HISTORY: Social History   Social History  . Marital Status: Married    Spouse Name: N/A  . Number of Children: N/A  . Years of Education: N/A    Occupational History  . Not on file.   Social History Main Topics  . Smoking status: Current Every Day Smoker -- 0.25 packs/day for 20 years    Types: Cigars, Cigarettes  . Smokeless tobacco: Not on file     Comment: smokes one cigar daily  . Alcohol Use: No  . Drug Use: No  . Sexual Activity: Not on file   Other Topics Concern  . Not on file   Social History Narrative    Allergies  Allergen Reactions  . Doxycycline Nausea And Vomiting    Current Outpatient Prescriptions  Medication Sig Dispense Refill  . albuterol (PROVENTIL HFA;VENTOLIN HFA) 108 (90 BASE) MCG/ACT inhaler Inhale 2 puffs into the lungs every 6 (six) hours as needed for wheezing.    Marland Kitchen aspirin 81 MG chewable tablet Chew 1 tablet (81 mg total) by mouth daily. 30 tablet 1  . cephALEXin (KEFLEX) 500 MG capsule Take 1 capsule (500 mg total) by mouth 4 (four) times daily. 21 capsule 0  . clopidogrel (PLAVIX) 75 MG tablet Take 75 mg by mouth daily.    . cyclobenzaprine (FLEXERIL) 10 MG tablet Take 1 tablet (10 mg total) by mouth 3 (three) times daily as needed for muscle spasms. 30 tablet 0  . diltiazem (TIAZAC) 120 MG  24 hr capsule Take 120 mg by mouth daily.    Marland Kitchen docusate sodium (COLACE) 100 MG capsule Take 100 mg by mouth 2 (two) times daily.    . ferrous sulfate 325 (65 FE) MG tablet Take 325 mg by mouth daily with breakfast.    . folic acid (FOLVITE) 1 MG tablet Take 1 mg by mouth daily.    Marland Kitchen HYDROcodone-acetaminophen (NORCO) 10-325 MG per tablet Take 1 tablet by mouth every 6 (six) hours as needed for pain.    Marland Kitchen ipratropium-albuterol (DUONEB) 0.5-2.5 (3) MG/3ML SOLN Take 3 mLs by nebulization 2 (two) times daily.    . isosorbide mononitrate (IMDUR) 30 MG 24 hr tablet Take 30 mg by mouth daily.    Marland Kitchen levothyroxine (SYNTHROID, LEVOTHROID) 50 MCG tablet Take 50 mcg by mouth daily before breakfast.    . Linaclotide (LINZESS) 290 MCG CAPS Take 290 capsules by mouth daily.    Marland Kitchen loratadine (CLARITIN) 10 MG tablet  Take 10 mg by mouth daily.    . meloxicam (MOBIC) 7.5 MG tablet Take 7.5 mg by mouth daily.    . methotrexate 25 MG/ML SOLN Take 15 mg by mouth once a week.    . mirtazapine (REMERON) 15 MG tablet Take 15 mg by mouth at bedtime.    Marland Kitchen omeprazole (PRILOSEC) 20 MG capsule Take 20 mg by mouth daily.    . pantoprazole (PROTONIX) 20 MG tablet Take 20 mg by mouth daily.    . tamsulosin (FLOMAX) 0.4 MG CAPS capsule Take 0.4 mg by mouth daily after supper.    . vitamin C (ASCORBIC ACID) 500 MG tablet Take 500 mg by mouth daily.    . Vitamin D, Ergocalciferol, (DRISDOL) 50000 units CAPS capsule Take 50,000 Units by mouth every 7 (seven) days.     No current facility-administered medications for this visit.    REVIEW OF SYSTEMS:  [X]  denotes positive finding, [ ]  denotes negative finding Cardiac  Comments:  Chest pain or chest pressure:    Shortness of breath upon exertion:    Short of breath when lying flat:    Irregular heart rhythm:        Vascular    Pain in calf, thigh, or hip brought on by ambulation: x   Pain in feet at night that wakes you up from your sleep:  x   Blood clot in your veins:    Leg swelling:         Pulmonary    Oxygen at home:    Productive cough:     Wheezing:         Neurologic    Sudden weakness in arms or legs:     Sudden numbness in arms or legs:     Sudden onset of difficulty speaking or slurred speech:    Temporary loss of vision in one eye:     Problems with dizziness:         Gastrointestinal    Blood in stool:     Vomited blood:         Genitourinary    Burning when urinating:     Blood in urine:        Psychiatric    Major depression:         Hematologic    Bleeding problems:    Problems with blood clotting too easily:        Skin    Rashes or ulcers:        Constitutional  Fever or chills:      PHYSICAL EXAM: Filed Vitals:   10/28/15 0907  BP: 94/55  Pulse: 76  Temp: 97 F (36.1 C)  TempSrc: Oral  Height: 5\' 4"  (1.626 m)   Weight: 145 lb (65.772 kg)  SpO2: 100%    GENERAL: The patient is a well-nourished male, in no acute distress. The vital signs are documented above. sitting in a wheelchair CARDIAC: There is a regular rate and rhythm.  VASCULAR:  Radial and palpable femoral pulses. Absent popliteal and distal pulses bilaterally PULMONARY: There is good air exchange bilaterally without wheezing or rales. ABDOMEN: Soft and non-tender with normal pitched bowel sounds.  MUSCULOSKELETAL:  Severe deformities in both hands and both knees related to severe arthritis. NEUROLOGIC: No focal weakness or paresthesias are detected. SKIN:  Open ulceration in his right second toe with gangrenous changes on to his forefoot. The joint space and tendons are exposed in his toe itself. PSYCHIATRIC: The patient has a normal affect.  DATA:  I do have noninvasive studies which I reviewed from an outlying noninvasive lab. This was from 10/21/2015. She has evidence of superficial femoral and tibial disease.  By hand-held Doppler he does have audible flow at the dorsalis pedis and posterior tibial but this is dampened  MEDICAL ISSUES:  Gangrenous changes right second toe extending onto his forefoot. Had a very long discussion with the patient and his grandson post present. With his degree of arthritis and inability to straighten his knee, he has essentially bed to chair transfer. He does not walk. He does have better ability to straighten his left leg. I explained that have feel that aggressive attempts with arteriography and femoral-popliteal bypass and transmetatarsal amputation with still high risk for higher level of amputation would be inappropriate with his nonambulatory state. I explained that even if he did not have major complications from this aggressive approach he would be at high risk for amputation. If he was able to have a transmetatarsal amputation heel, still would not be able to walk due to his severe arthritis. For  these reasons I've recommended primary above and knee amputation. Explained that this has a very high likelihood of being able to heal. Spleen that he would have minimal impact on his quality of life since he is bed to chair assist currently and would still be able to pivot on his left leg. Most importantly it would alleviate the severe pain that he is currently having in his right foot. He understands he and his grandson wish to proceed next week. He will be admitted to [facility] hospital on the day of surgery and should be able to be discharged back to the nursing facility after several day hospitalization   Early, [place] Vascular and Vein Specialists of [place]: [phone]

## 2015-10-29 ENCOUNTER — Other Ambulatory Visit: Payer: Self-pay

## 2015-11-03 ENCOUNTER — Encounter (HOSPITAL_COMMUNITY): Payer: Self-pay | Admitting: *Deleted

## 2015-11-03 NOTE — Progress Notes (Addendum)
Anesthesia Chart Review: SAME DAY WORK-UP.  Patient is a 82 year old male scheduled for right AKA on 11/05/15 by Dr. Donnetta Hutching. Patient resides at [facility].  History includes smoking, COPD, PVD with non-healing right second toe ulcer, GERD, HTN, RA, gout, prostate cancer, TURP 05/01/08 and 05/27/13, cataract extraction 04/28/13. CAD is also listed, but this history is unclear. Patient resides in a nursing home, and reportedly is a poor historian. Our PAT RN called and spoke with his nephew and grandson who said they did not know any details about a possible CAD history, and deferred her to Dr. Hughie Closs office. Staff there could not recall any specific details either other then he was seen by cardiology in the distant past. They did not have any copies of cardiac studies other than a 2012 EKG which they are faxing. The only cardiology records in [place] are from Dr. Lattie Haw from 05/2005 during an admission for chest pain. According to the 05/29/05 discharge summary, he had minimal troponin elevation at 0.13 with negative CK-MBs. Myoview and echo done (see below).  05/29/05 Echo: SUMMARY - Overall left ventricular systolic function was mildly decreased. Left ventricular ejection fraction was estimated , range being 40 % to 50 %.. There was mild diffuse left ventricular hypokinesis. - The left atrium was mildly dilated.  05/29/05 Nuclear stress test: IMPRESSION: 1. No evidence of inducible left ventricular myocardial ischemia.  2. Left ventricular ejection fraction calculated at 51%.  Med list has not yet been updated. Dr. Donnetta Hutching instructed patient to hold Plavix for 5 days prior to surgery.   Above reviewed with anesthesiologist Dr. Conrad [place]. Patient will need an EKG and further evaluation by his anesthesiologist on the day of surgery to determine the definitive plan.   I'll review PCP records and any cardiology records from [facility] once received.  George Hugh [facility]  Short Stay Center/Anesthesiology Phone [phone] 11/03/2015 5:36 PM  Addendum: Records from Dr. Melina Copa and [facility] received. He was sent to the ED by his PCP on 12/13/10 for what appeared to be afib on EKG. Cardiology consult note by Dr. Fletcher Anon at [facility] on 12/14/10 does not mention afib, but states patient patient was admitted for chest pain. There was no known prior cardiac history at that time. EKGs at [facility] showed SR with frequent PACs. Stress and echo ordered (see below).  12/16/10 Echo ([place]): Conclusions: 1. There is normal left ventricular systolic function. Global Lv wall motion and contractility are WNL. 2. The estimated EF is 55-60%. 3. Abnormal left ventricular diastolic filling is observed, consistent with impaired relaxation. 4. There is mild mitral regurgitation. 5. There is mild to moderate tricuspid regurgitation. 6. No pulmonary hypertension is noted.  12/16/10 Nuclear stress test [facility]): Conclusion: Probably normal LV perfusion. Probably normal with Tc- 47m sestamibi imaging. Stress testing induced no chest pain symptoms and no EKG changes consistent with ischemia. Poor quality study with arms down during stress imaging. Global left ventricular systolic function was normal, with an EF of 55%. In addition there was normal wall motion. There was a medium, fixed, mid to basal inferior defect associated with normal wall motion. There is significant motion artifact. Defect likely artifact and not reversible.  Med list from [facility] includes ASA, Claritin, diltiazem, folic acid, isosorbide mononitrate, levothyroxine, Linzess, methotrexate, Protonix, Remeron, tamsulosin.    As planned, he will get an EKG and labs on arrival and evaluation from his surgeon and anesthesiologist to ensure results are acceptable and that there are  no acute CV/CHF symptoms prior to proceeding.   George Hugh [facility] Short Stay Center/Anesthesiology Phone [phone] 11/04/2015 1:52 PM

## 2015-11-03 NOTE — Progress Notes (Signed)
Pt nurse made aware to stop otc vitamins and herbal medications.

## 2015-11-03 NOTE — Progress Notes (Signed)
Pt SDW-pre-op call completed by pt nurse Jaymes Graff, LPN of [facility]; nurse was able to provide medical history from pt chart ( no date recorded for cardiac cath and CAD diagnosis) . Nurse stated that pt last dose of Plavix was 10/29/15. Nurse to fax current MAR. Pt denies SOB and chest pain. Pt is a poor historian and unable to answer any assessment questions asked. Contacted next of Kin, Kerry Dory ( nephew) and grandson Jannifer Franklin, both whom were unable to provide any medical history. Dr. Hughie Closs office was contacted via telephone regarding pt records; no record of stress, echo and cardiac cath, only EKG ( 2012) and LOV notes were available and faxed. Anesthesia asked to review pt history.

## 2015-11-04 MED ORDER — SODIUM CHLORIDE 0.9 % IV SOLN: INTRAVENOUS | Status: DC

## 2015-11-04 MED ORDER — DEXTROSE 5 % IV SOLN
1.5000 g | INTRAVENOUS | Status: AC
  Administered 2015-11-05: 1.5 g via INTRAVENOUS
  Filled 2015-11-04 (×2): qty 1.5

## 2015-11-05 ENCOUNTER — Encounter (HOSPITAL_COMMUNITY): Payer: Self-pay | Admitting: *Deleted

## 2015-11-05 ENCOUNTER — Encounter (HOSPITAL_COMMUNITY)
Admission: RE | Disposition: A | Discharge status: Skilled Nursing Facility | Payer: Self-pay | Source: Ambulatory Visit | Attending: Internal Medicine

## 2015-11-05 ENCOUNTER — Inpatient Hospital Stay (HOSPITAL_COMMUNITY)
Admission: RE | Admit: 2015-11-05 | Discharge: 2015-11-08 | DRG: 241 | Disposition: A | Discharge status: Skilled Nursing Facility | Payer: Medicare Other | Source: Ambulatory Visit | Attending: Internal Medicine | Admitting: Internal Medicine

## 2015-11-05 ENCOUNTER — Inpatient Hospital Stay (HOSPITAL_COMMUNITY): Payer: Medicare Other | Admitting: Emergency Medicine

## 2015-11-05 DIAGNOSIS — I129 Hypertensive chronic kidney disease with stage 1 through stage 4 chronic kidney disease, or unspecified chronic kidney disease: Secondary | ICD-10-CM | POA: Diagnosis present

## 2015-11-05 DIAGNOSIS — L97509 Non-pressure chronic ulcer of other part of unspecified foot with unspecified severity: Secondary | ICD-10-CM | POA: Diagnosis present

## 2015-11-05 DIAGNOSIS — I96 Gangrene, not elsewhere classified: Principal | ICD-10-CM | POA: Diagnosis present

## 2015-11-05 DIAGNOSIS — M24561 Contracture, right knee: Secondary | ICD-10-CM | POA: Diagnosis present

## 2015-11-05 DIAGNOSIS — Z87891 Personal history of nicotine dependence: Secondary | ICD-10-CM

## 2015-11-05 DIAGNOSIS — Z8249 Family history of ischemic heart disease and other diseases of the circulatory system: Secondary | ICD-10-CM | POA: Diagnosis not present

## 2015-11-05 DIAGNOSIS — M109 Gout, unspecified: Secondary | ICD-10-CM | POA: Diagnosis present

## 2015-11-05 DIAGNOSIS — I10 Essential (primary) hypertension: Secondary | ICD-10-CM

## 2015-11-05 DIAGNOSIS — I2510 Atherosclerotic heart disease of native coronary artery without angina pectoris: Secondary | ICD-10-CM | POA: Diagnosis present

## 2015-11-05 DIAGNOSIS — R54 Age-related physical debility: Secondary | ICD-10-CM | POA: Diagnosis present

## 2015-11-05 DIAGNOSIS — Z7409 Other reduced mobility: Secondary | ICD-10-CM | POA: Diagnosis present

## 2015-11-05 DIAGNOSIS — F329 Major depressive disorder, single episode, unspecified: Secondary | ICD-10-CM | POA: Diagnosis present

## 2015-11-05 DIAGNOSIS — M24551 Contracture, right hip: Secondary | ICD-10-CM | POA: Diagnosis present

## 2015-11-05 DIAGNOSIS — Z79899 Other long term (current) drug therapy: Secondary | ICD-10-CM

## 2015-11-05 DIAGNOSIS — M069 Rheumatoid arthritis, unspecified: Secondary | ICD-10-CM

## 2015-11-05 DIAGNOSIS — M05762 Rheumatoid arthritis with rheumatoid factor of left knee without organ or systems involvement: Secondary | ICD-10-CM

## 2015-11-05 DIAGNOSIS — L97519 Non-pressure chronic ulcer of other part of right foot with unspecified severity: Secondary | ICD-10-CM | POA: Diagnosis present

## 2015-11-05 DIAGNOSIS — D649 Anemia, unspecified: Secondary | ICD-10-CM

## 2015-11-05 DIAGNOSIS — K5909 Other constipation: Secondary | ICD-10-CM | POA: Diagnosis not present

## 2015-11-05 DIAGNOSIS — J449 Chronic obstructive pulmonary disease, unspecified: Secondary | ICD-10-CM | POA: Diagnosis present

## 2015-11-05 DIAGNOSIS — I70261 Atherosclerosis of native arteries of extremities with gangrene, right leg: Secondary | ICD-10-CM

## 2015-11-05 DIAGNOSIS — K219 Gastro-esophageal reflux disease without esophagitis: Secondary | ICD-10-CM | POA: Diagnosis present

## 2015-11-05 DIAGNOSIS — M059 Rheumatoid arthritis with rheumatoid factor, unspecified: Secondary | ICD-10-CM | POA: Diagnosis present

## 2015-11-05 DIAGNOSIS — Z9079 Acquired absence of other genital organ(s): Secondary | ICD-10-CM | POA: Diagnosis not present

## 2015-11-05 DIAGNOSIS — Z7982 Long term (current) use of aspirin: Secondary | ICD-10-CM | POA: Diagnosis not present

## 2015-11-05 DIAGNOSIS — R410 Disorientation, unspecified: Secondary | ICD-10-CM | POA: Diagnosis present

## 2015-11-05 DIAGNOSIS — Z809 Family history of malignant neoplasm, unspecified: Secondary | ICD-10-CM | POA: Diagnosis not present

## 2015-11-05 DIAGNOSIS — S91109A Unspecified open wound of unspecified toe(s) without damage to nail, initial encounter: Secondary | ICD-10-CM | POA: Diagnosis present

## 2015-11-05 DIAGNOSIS — N400 Enlarged prostate without lower urinary tract symptoms: Secondary | ICD-10-CM | POA: Diagnosis not present

## 2015-11-05 DIAGNOSIS — K5900 Constipation, unspecified: Secondary | ICD-10-CM | POA: Diagnosis present

## 2015-11-05 DIAGNOSIS — H9190 Unspecified hearing loss, unspecified ear: Secondary | ICD-10-CM | POA: Diagnosis present

## 2015-11-05 DIAGNOSIS — N183 Chronic kidney disease, stage 3 unspecified: Secondary | ICD-10-CM

## 2015-11-05 DIAGNOSIS — Z881 Allergy status to other antibiotic agents status: Secondary | ICD-10-CM

## 2015-11-05 DIAGNOSIS — E039 Hypothyroidism, unspecified: Secondary | ICD-10-CM | POA: Diagnosis present

## 2015-11-05 DIAGNOSIS — I739 Peripheral vascular disease, unspecified: Secondary | ICD-10-CM | POA: Diagnosis present

## 2015-11-05 DIAGNOSIS — S91109D Unspecified open wound of unspecified toe(s) without damage to nail, subsequent encounter: Secondary | ICD-10-CM

## 2015-11-05 DIAGNOSIS — M05761 Rheumatoid arthritis with rheumatoid factor of right knee without organ or systems involvement: Secondary | ICD-10-CM

## 2015-11-05 HISTORY — DX: Depression, unspecified: F32.A

## 2015-11-05 HISTORY — DX: Hypothyroidism, unspecified: E03.9

## 2015-11-05 HISTORY — DX: Benign prostatic hyperplasia without lower urinary tract symptoms: N40.0

## 2015-11-05 HISTORY — DX: Angina pectoris, unspecified: I20.9

## 2015-11-05 HISTORY — PX: AMPUTATION: SHX166

## 2015-11-05 HISTORY — DX: Major depressive disorder, single episode, unspecified: F32.9

## 2015-11-05 LAB — IRON AND TIBC
IRON: 18 ug/dL — AB (ref 45–182)
Saturation Ratios: 8 % — ABNORMAL LOW (ref 17.9–39.5)
TIBC: 214 ug/dL — ABNORMAL LOW (ref 250–450)
UIBC: 196 ug/dL

## 2015-11-05 LAB — BASIC METABOLIC PANEL
Anion gap: 13 (ref 5–15)
BUN: 35 mg/dL — ABNORMAL HIGH (ref 6–20)
CHLORIDE: 103 mmol/L (ref 101–111)
CO2: 23 mmol/L (ref 22–32)
CREATININE: 1.26 mg/dL — AB (ref 0.61–1.24)
Calcium: 9.7 mg/dL (ref 8.9–10.3)
GFR calc Af Amer: 57 mL/min — ABNORMAL LOW (ref >60)
GFR calc non Af Amer: 49 mL/min — ABNORMAL LOW (ref >60)
GLUCOSE: 89 mg/dL (ref 65–99)
POTASSIUM: 4.6 mmol/L (ref 3.5–5.1)
Sodium: 139 mmol/L (ref 135–145)

## 2015-11-05 LAB — SURGICAL PCR SCREEN
MRSA, PCR: NEGATIVE
Staphylococcus aureus: POSITIVE — AB

## 2015-11-05 LAB — CBC
HCT: 29.5 % — ABNORMAL LOW (ref 39.0–52.0)
Hemoglobin: 9.2 g/dL — ABNORMAL LOW (ref 13.0–17.0)
MCH: 27.6 pg (ref 26.0–34.0)
MCHC: 31.2 g/dL (ref 30.0–36.0)
MCV: 88.6 fL (ref 78.0–100.0)
Platelets: 285 10*3/uL (ref 150–400)
RBC: 3.33 MIL/uL — ABNORMAL LOW (ref 4.22–5.81)
RDW: 13.6 % (ref 11.5–15.5)
WBC: 6.1 10*3/uL (ref 4.0–10.5)

## 2015-11-05 LAB — RETICULOCYTES
RBC.: 4.34 MIL/uL (ref 4.22–5.81)
RETIC COUNT ABSOLUTE: 47.7 10*3/uL (ref 19.0–186.0)
Retic Ct Pct: 1.1 % (ref 0.4–3.1)

## 2015-11-05 LAB — PROTIME-INR
INR: 1.07 (ref 0.00–1.49)
PROTHROMBIN TIME: 14.1 s (ref 11.6–15.2)

## 2015-11-05 LAB — FERRITIN: FERRITIN: 179 ng/mL (ref 24–336)

## 2015-11-05 LAB — VITAMIN B12: Vitamin B-12: 255 pg/mL (ref 180–914)

## 2015-11-05 LAB — FOLATE: FOLATE: 28 ng/mL (ref >5.9)

## 2015-11-05 SURGERY — AMPUTATION, ABOVE KNEE
Anesthesia: General | Site: Leg Upper | Laterality: Right

## 2015-11-05 MED ORDER — ONDANSETRON HCL 4 MG PO TABS: 4.0000 mg | ORAL_TABLET | Freq: Four times a day (QID) | ORAL | Status: DC | PRN

## 2015-11-05 MED ORDER — LIDOCAINE HCL (CARDIAC) 20 MG/ML IV SOLN
INTRAVENOUS | Status: DC | PRN
  Administered 2015-11-05: 100 mg via INTRAVENOUS

## 2015-11-05 MED ORDER — LABETALOL HCL 5 MG/ML IV SOLN
10.0000 mg | INTRAVENOUS | Status: DC | PRN
Start: 2015-11-05 — End: 2015-11-07

## 2015-11-05 MED ORDER — POTASSIUM CHLORIDE CRYS ER 20 MEQ PO TBCR: 20.0000 meq | EXTENDED_RELEASE_TABLET | Freq: Every day | ORAL | Status: DC | PRN

## 2015-11-05 MED ORDER — ASPIRIN 81 MG PO CHEW
81.0000 mg | CHEWABLE_TABLET | Freq: Every day | ORAL | Status: DC
  Administered 2015-11-05 – 2015-11-08 (×4): 81 mg via ORAL
  Filled 2015-11-05 (×4): qty 1

## 2015-11-05 MED ORDER — CHLORHEXIDINE GLUCONATE 4 % EX LIQD: 60.0000 mL | Freq: Once | CUTANEOUS | Status: DC

## 2015-11-05 MED ORDER — GUAIFENESIN-DM 100-10 MG/5ML PO SYRP: 15.0000 mL | ORAL_SOLUTION | ORAL | Status: DC | PRN

## 2015-11-05 MED ORDER — ONDANSETRON HCL 4 MG/2ML IJ SOLN
4.0000 mg | Freq: Once | INTRAMUSCULAR | Status: AC
  Administered 2015-11-05: 4 mg via INTRAVENOUS
  Filled 2015-11-05: qty 2

## 2015-11-05 MED ORDER — KETAMINE HCL 100 MG/ML IJ SOLN
INTRAMUSCULAR | Status: AC
  Filled 2015-11-05: qty 1

## 2015-11-05 MED ORDER — HYDROMORPHONE HCL 1 MG/ML IJ SOLN
0.2500 mg | INTRAMUSCULAR | Status: DC | PRN
  Administered 2015-11-05 (×2): 0.25 mg via INTRAVENOUS

## 2015-11-05 MED ORDER — ROCURONIUM BROMIDE 100 MG/10ML IV SOLN
INTRAVENOUS | Status: DC | PRN
  Administered 2015-11-05: 40 mg via INTRAVENOUS

## 2015-11-05 MED ORDER — MAGNESIUM SULFATE 2 GM/50ML IV SOLN
2.0000 g | Freq: Every day | INTRAVENOUS | Status: DC | PRN
  Filled 2015-11-05: qty 50

## 2015-11-05 MED ORDER — ISOSORBIDE MONONITRATE ER 30 MG PO TB24
15.0000 mg | ORAL_TABLET | Freq: Every day | ORAL | Status: DC
Start: 2015-11-06 — End: 2015-11-08
  Administered 2015-11-06 – 2015-11-08 (×3): 15 mg via ORAL
  Filled 2015-11-05 (×4): qty 1

## 2015-11-05 MED ORDER — SUGAMMADEX SODIUM 200 MG/2ML IV SOLN
INTRAVENOUS | Status: DC | PRN
  Administered 2015-11-05: 130 mg via INTRAVENOUS

## 2015-11-05 MED ORDER — PANTOPRAZOLE SODIUM 40 MG PO TBEC
40.0000 mg | DELAYED_RELEASE_TABLET | Freq: Every day | ORAL | Status: DC
  Administered 2015-11-05 – 2015-11-08 (×4): 40 mg via ORAL
  Filled 2015-11-05 (×4): qty 1

## 2015-11-05 MED ORDER — ONDANSETRON HCL 4 MG/2ML IJ SOLN
INTRAMUSCULAR | Status: DC | PRN
  Administered 2015-11-05: 4 mg via INTRAVENOUS

## 2015-11-05 MED ORDER — ENOXAPARIN SODIUM 30 MG/0.3ML ~~LOC~~ SOLN
30.0000 mg | SUBCUTANEOUS | Status: DC
  Administered 2015-11-06 – 2015-11-08 (×3): 30 mg via SUBCUTANEOUS
  Filled 2015-11-05 (×3): qty 0.3

## 2015-11-05 MED ORDER — 0.9 % SODIUM CHLORIDE (POUR BTL) OPTIME
TOPICAL | Status: DC | PRN
  Administered 2015-11-05: 1000 mL

## 2015-11-05 MED ORDER — METOPROLOL TARTRATE 1 MG/ML IV SOLN: 2.0000 mg | INTRAVENOUS | Status: DC | PRN

## 2015-11-05 MED ORDER — DILTIAZEM HCL ER COATED BEADS 120 MG PO CP24
120.0000 mg | ORAL_CAPSULE | Freq: Every day | ORAL | Status: DC
  Administered 2015-11-06 – 2015-11-08 (×3): 120 mg via ORAL
  Filled 2015-11-05 (×4): qty 1

## 2015-11-05 MED ORDER — LEVOTHYROXINE SODIUM 50 MCG PO TABS
50.0000 ug | ORAL_TABLET | Freq: Every day | ORAL | Status: DC
  Administered 2015-11-06 – 2015-11-08 (×3): 50 ug via ORAL
  Filled 2015-11-05: qty 1
  Filled 2015-11-05: qty 2
  Filled 2015-11-05: qty 1

## 2015-11-05 MED ORDER — ONDANSETRON HCL 4 MG/2ML IJ SOLN: 4.0000 mg | Freq: Once | INTRAMUSCULAR | Status: DC | PRN

## 2015-11-05 MED ORDER — MAGNESIUM HYDROXIDE 400 MG/5ML PO SUSP: 30.0000 mL | Freq: Every day | ORAL | Status: DC | PRN

## 2015-11-05 MED ORDER — KETAMINE HCL 10 MG/ML IJ SOLN
INTRAMUSCULAR | Status: DC | PRN
  Administered 2015-11-05: 30 mg via INTRAVENOUS

## 2015-11-05 MED ORDER — PHENYLEPHRINE HCL 10 MG/ML IJ SOLN
INTRAMUSCULAR | Status: DC | PRN
  Administered 2015-11-05 (×4): 40 ug via INTRAVENOUS

## 2015-11-05 MED ORDER — BISACODYL 10 MG RE SUPP: 10.0000 mg | Freq: Every day | RECTAL | Status: DC | PRN

## 2015-11-05 MED ORDER — MORPHINE SULFATE (PF) 2 MG/ML IV SOLN
2.0000 mg | INTRAVENOUS | Status: DC | PRN
  Administered 2015-11-05 – 2015-11-06 (×4): 2 mg via INTRAVENOUS
  Administered 2015-11-06: 4 mg via INTRAVENOUS
  Administered 2015-11-06: 2 mg via INTRAVENOUS
  Administered 2015-11-06: 4 mg via INTRAVENOUS
  Filled 2015-11-05: qty 1
  Filled 2015-11-05 (×2): qty 2
  Filled 2015-11-05 (×2): qty 1
  Filled 2015-11-05: qty 2

## 2015-11-05 MED ORDER — HYDRALAZINE HCL 20 MG/ML IJ SOLN: 5.0000 mg | INTRAMUSCULAR | Status: DC | PRN

## 2015-11-05 MED ORDER — ONDANSETRON HCL 4 MG/2ML IJ SOLN
INTRAMUSCULAR | Status: AC
  Filled 2015-11-05: qty 2

## 2015-11-05 MED ORDER — ACETAMINOPHEN 325 MG PO TABS: 325.0000 mg | ORAL_TABLET | ORAL | Status: DC | PRN

## 2015-11-05 MED ORDER — ALUM & MAG HYDROXIDE-SIMETH 200-200-20 MG/5ML PO SUSP: 15.0000 mL | ORAL | Status: DC | PRN

## 2015-11-05 MED ORDER — OXYCODONE-ACETAMINOPHEN 5-325 MG PO TABS
1.0000 | ORAL_TABLET | ORAL | Status: DC | PRN
  Administered 2015-11-05 – 2015-11-07 (×6): 2 via ORAL
  Administered 2015-11-08: 1 via ORAL
  Filled 2015-11-05 (×4): qty 2
  Filled 2015-11-05: qty 1
  Filled 2015-11-05 (×3): qty 2

## 2015-11-05 MED ORDER — TAMSULOSIN HCL 0.4 MG PO CAPS
0.4000 mg | ORAL_CAPSULE | Freq: Every day | ORAL | Status: DC
  Administered 2015-11-05 – 2015-11-07 (×3): 0.4 mg via ORAL
  Filled 2015-11-05 (×3): qty 1

## 2015-11-05 MED ORDER — PHENOL 1.4 % MT LIQD: 1.0000 | OROMUCOSAL | Status: DC | PRN

## 2015-11-05 MED ORDER — SUGAMMADEX SODIUM 200 MG/2ML IV SOLN
INTRAVENOUS | Status: AC
  Filled 2015-11-05: qty 2

## 2015-11-05 MED ORDER — FENTANYL CITRATE (PF) 100 MCG/2ML IJ SOLN
25.0000 ug | INTRAMUSCULAR | Status: DC | PRN
  Administered 2015-11-05 (×2): 25 ug via INTRAVENOUS
  Administered 2015-11-05: 50 ug via INTRAVENOUS

## 2015-11-05 MED ORDER — MIRTAZAPINE 15 MG PO TABS
15.0000 mg | ORAL_TABLET | Freq: Every day | ORAL | Status: DC
  Administered 2015-11-05 – 2015-11-07 (×3): 15 mg via ORAL
  Filled 2015-11-05 (×3): qty 1

## 2015-11-05 MED ORDER — PROPOFOL 10 MG/ML IV BOLUS
INTRAVENOUS | Status: DC | PRN
  Administered 2015-11-05: 50 mg via INTRAVENOUS

## 2015-11-05 MED ORDER — MUPIROCIN 2 % EX OINT
TOPICAL_OINTMENT | CUTANEOUS | Status: AC
  Filled 2015-11-05: qty 22

## 2015-11-05 MED ORDER — SODIUM CHLORIDE 0.9 % IV SOLN
INTRAVENOUS | Status: DC
  Administered 2015-11-06 – 2015-11-07 (×2): via INTRAVENOUS

## 2015-11-05 MED ORDER — METHOTREXATE SODIUM CHEMO INJECTION 25 MG/ML PF: 15.0000 mg | INTRAMUSCULAR | Status: DC

## 2015-11-05 MED ORDER — SODIUM CHLORIDE 0.45 % IV SOLN
INTRAVENOUS | Status: DC
  Administered 2015-11-05: 18:00:00 via INTRAVENOUS

## 2015-11-05 MED ORDER — ACETAMINOPHEN 650 MG RE SUPP: 325.0000 mg | RECTAL | Status: DC | PRN

## 2015-11-05 MED ORDER — PROMETHAZINE HCL 25 MG PO TABS: 25.0000 mg | ORAL_TABLET | ORAL | Status: DC | PRN

## 2015-11-05 MED ORDER — FENTANYL CITRATE (PF) 250 MCG/5ML IJ SOLN
INTRAMUSCULAR | Status: AC
  Filled 2015-11-05: qty 5

## 2015-11-05 MED ORDER — DOCUSATE SODIUM 100 MG PO CAPS
100.0000 mg | ORAL_CAPSULE | Freq: Every day | ORAL | Status: DC
  Administered 2015-11-07 – 2015-11-08 (×2): 100 mg via ORAL
  Filled 2015-11-05 (×3): qty 1

## 2015-11-05 MED ORDER — LACTATED RINGERS IV SOLN
INTRAVENOUS | Status: DC
  Administered 2015-11-05: 10:00:00 via INTRAVENOUS

## 2015-11-05 MED ORDER — PHENYLEPHRINE HCL 10 MG/ML IJ SOLN
10.0000 mg | INTRAVENOUS | Status: DC | PRN
  Administered 2015-11-05: 15 ug/min via INTRAVENOUS

## 2015-11-05 MED ORDER — SENNOSIDES-DOCUSATE SODIUM 8.6-50 MG PO TABS: 1.0000 | ORAL_TABLET | Freq: Every evening | ORAL | Status: DC | PRN

## 2015-11-05 MED ORDER — FENTANYL CITRATE (PF) 100 MCG/2ML IJ SOLN
INTRAMUSCULAR | Status: AC
  Administered 2015-11-05: 25 ug via INTRAVENOUS
  Filled 2015-11-05: qty 2

## 2015-11-05 MED ORDER — HYDROMORPHONE HCL 1 MG/ML IJ SOLN
INTRAMUSCULAR | Status: AC
  Administered 2015-11-05: 0.25 mg via INTRAVENOUS
  Filled 2015-11-05: qty 1

## 2015-11-05 MED ORDER — DEXTROSE 5 % IV SOLN
1.5000 g | Freq: Two times a day (BID) | INTRAVENOUS | Status: AC
  Administered 2015-11-05 – 2015-11-06 (×2): 1.5 g via INTRAVENOUS
  Filled 2015-11-05 (×2): qty 1.5

## 2015-11-05 MED ORDER — SENNA 8.6 MG PO TABS
1.0000 | ORAL_TABLET | Freq: Two times a day (BID) | ORAL | Status: DC
  Administered 2015-11-05 – 2015-11-08 (×4): 8.6 mg via ORAL
  Filled 2015-11-05 (×6): qty 1

## 2015-11-05 MED ORDER — LACTATED RINGERS IV SOLN
INTRAVENOUS | Status: DC | PRN
  Administered 2015-11-05 (×2): via INTRAVENOUS

## 2015-11-05 MED ORDER — ONDANSETRON HCL 4 MG/2ML IJ SOLN: 4.0000 mg | Freq: Four times a day (QID) | INTRAMUSCULAR | Status: DC | PRN

## 2015-11-05 MED ORDER — FENTANYL CITRATE (PF) 100 MCG/2ML IJ SOLN
INTRAMUSCULAR | Status: DC | PRN
  Administered 2015-11-05: 100 ug via INTRAVENOUS
  Administered 2015-11-05 (×2): 50 ug via INTRAVENOUS

## 2015-11-05 SURGICAL SUPPLY — 39 items
BANDAGE ELASTIC 6 VELCRO ST LF (GAUZE/BANDAGES/DRESSINGS) ×3 IMPLANT
BNDG GAUZE ELAST 4 BULKY (GAUZE/BANDAGES/DRESSINGS) ×3 IMPLANT
CANISTER SUCTION 2500CC (MISCELLANEOUS) ×3 IMPLANT
CLIP LIGATING EXTRA MED SLVR (CLIP) ×3 IMPLANT
CLIP LIGATING EXTRA SM BLUE (MISCELLANEOUS) ×3 IMPLANT
COVER SURGICAL LIGHT HANDLE (MISCELLANEOUS) ×3 IMPLANT
DRAPE ORTHO SPLIT 77X108 STRL (DRAPES) ×4
DRAPE PROXIMA HALF (DRAPES) ×3 IMPLANT
DRAPE SURG ORHT 6 SPLT 77X108 (DRAPES) ×2 IMPLANT
DRSG ADAPTIC 3X8 NADH LF (GAUZE/BANDAGES/DRESSINGS) ×3 IMPLANT
ELECT REM PT RETURN 9FT ADLT (ELECTROSURGICAL) ×3
ELECTRODE REM PT RTRN 9FT ADLT (ELECTROSURGICAL) ×1 IMPLANT
GAUZE SPONGE 4X4 12PLY STRL (GAUZE/BANDAGES/DRESSINGS) ×6 IMPLANT
GAUZE XEROFORM 5X9 LF (GAUZE/BANDAGES/DRESSINGS) ×3 IMPLANT
GLOVE BIOGEL PI IND STRL 7.0 (GLOVE) ×1 IMPLANT
GLOVE BIOGEL PI IND STRL 7.5 (GLOVE) ×1 IMPLANT
GLOVE BIOGEL PI INDICATOR 7.0 (GLOVE) ×2
GLOVE BIOGEL PI INDICATOR 7.5 (GLOVE) ×2
GLOVE SS BIOGEL STRL SZ 7 (GLOVE) ×2 IMPLANT
GLOVE SS BIOGEL STRL SZ 7.5 (GLOVE) ×1 IMPLANT
GLOVE SUPERSENSE BIOGEL SZ 7 (GLOVE) ×4
GLOVE SUPERSENSE BIOGEL SZ 7.5 (GLOVE) ×2
GLOVE SURG SS PI 6.5 STRL IVOR (GLOVE) ×3 IMPLANT
GOWN STRL REUS W/ TWL LRG LVL3 (GOWN DISPOSABLE) ×3 IMPLANT
GOWN STRL REUS W/TWL LRG LVL3 (GOWN DISPOSABLE) ×6
KIT BASIN OR (CUSTOM PROCEDURE TRAY) ×3 IMPLANT
KIT ROOM TURNOVER OR (KITS) ×3 IMPLANT
NS IRRIG 1000ML POUR BTL (IV SOLUTION) ×3 IMPLANT
PACK GENERAL/GYN (CUSTOM PROCEDURE TRAY) ×3 IMPLANT
PAD ARMBOARD 7.5X6 YLW CONV (MISCELLANEOUS) ×6 IMPLANT
SAW GIGLI STERILE 20 (MISCELLANEOUS) ×3 IMPLANT
SPONGE GAUZE 4X4 12PLY STER LF (GAUZE/BANDAGES/DRESSINGS) ×3 IMPLANT
STAPLER VISISTAT 35W (STAPLE) ×3 IMPLANT
STOCKINETTE IMPERVIOUS LG (DRAPES) ×3 IMPLANT
SUT VIC AB 0 CT1 18XCR BRD 8 (SUTURE) ×2 IMPLANT
SUT VIC AB 0 CT1 8-18 (SUTURE) ×4
SUT VICRYL AB 2 0 TIES (SUTURE) ×3 IMPLANT
UNDERPAD 30X30 INCONTINENT (UNDERPADS AND DIAPERS) ×3 IMPLANT
WATER STERILE IRR 1000ML POUR (IV SOLUTION) ×3 IMPLANT

## 2015-11-05 NOTE — Progress Notes (Signed)
Patient is settled into room and now resting. Upon assessment, the patient has several round open lesions on his left leg and one on his left arm. Will continue to monitor them.

## 2015-11-05 NOTE — Transfer of Care (Addendum)
Immediate Anesthesia Transfer of Care Note  Patient: John Plowman Sr.  Procedure(s) Performed: Procedure(s): AMPUTATION ABOVE KNEE (Right)  Patient Location: PACU  Anesthesia Type:General  Level of Consciousness: awake and responds to stimulation  Airway & Oxygen Therapy: Patient Spontanous Breathing and Patient connected to face mask oxygen  Post-op Assessment: Report given to RN, Post -op Vital signs reviewed and stable and Patient moving all extremities X 4  Post vital signs: Reviewed and stable  Last Vitals:  Filed Vitals:   11/05/15 1001 11/05/15 1403  BP: 138/81   Pulse: 91   Temp: 36.6 C 36.4 C  Resp: 16     Complications: No apparent anesthesia complications

## 2015-11-05 NOTE — H&P (View-Only) (Signed)
Vascular and Vein Specialist of [place]  Patient name: John Sibole Sr. MRN: [id number] DOB: April 02, 1924 Sex: male  REASON FOR CONSULT: Non-healing right second toe ulcer.  HPI: John Botsch Sr. is a 82 y.o. male, who is seen today for evaluation of nonhealing right second toe ulcer. He is here today with his grandson who cares for him. He is a resident at the [facility] facility. His grandson reports that this ulceration over his toes when present for proximally 6 months. He has had various debridement and local wound care to this. I continues to have issues with nonhealing. He has severe arthritis with severe rotatory deformity of both hands and both lower extremities. He is unable to straighten his right knee past approximately 45. He reports that he can stand to transfer this seems doubtful in his current state. His grandson reports that he has had significant deterioration in his overall status over the past several months. He is somewhat hard of hearing but does seem to understand our discussion. Reports significant pain in his right foot reports this is worse at night.  Past Medical History  Diagnosis Date  . GERD (gastroesophageal reflux disease)   . COPD (chronic obstructive pulmonary disease) ([place])   . Peripheral vascular disease ([place])   . Hypertension   . Coronary artery disease   . Gout   . Arthritis     Rheumatoid  . Cancer [facility])     Prostate    Family History  Problem Relation Age of Onset  . Cancer Mother   . Heart disease Mother     before age 42  . Cancer Father   . Cancer Sister   . Heart disease Sister     before age 56  . Cancer Brother   . Heart disease Brother     before at 44  . Cancer Brother   . Cancer Brother   . Cancer Sister   . Cancer Brother   . Cancer Brother     SOCIAL HISTORY: Social History   Social History  . Marital Status: Married    Spouse Name: N/A  . Number of Children: N/A  . Years of Education: N/A    Occupational History  . Not on file.   Social History Main Topics  . Smoking status: Current Every Day Smoker -- 0.25 packs/day for 20 years    Types: Cigars, Cigarettes  . Smokeless tobacco: Not on file     Comment: smokes one cigar daily  . Alcohol Use: No  . Drug Use: No  . Sexual Activity: Not on file   Other Topics Concern  . Not on file   Social History Narrative    Allergies  Allergen Reactions  . Doxycycline Nausea And Vomiting    Current Outpatient Prescriptions  Medication Sig Dispense Refill  . albuterol (PROVENTIL HFA;VENTOLIN HFA) 108 (90 BASE) MCG/ACT inhaler Inhale 2 puffs into the lungs every 6 (six) hours as needed for wheezing.    Marland Kitchen aspirin 81 MG chewable tablet Chew 1 tablet (81 mg total) by mouth daily. 30 tablet 1  . cephALEXin (KEFLEX) 500 MG capsule Take 1 capsule (500 mg total) by mouth 4 (four) times daily. 21 capsule 0  . clopidogrel (PLAVIX) 75 MG tablet Take 75 mg by mouth daily.    . cyclobenzaprine (FLEXERIL) 10 MG tablet Take 1 tablet (10 mg total) by mouth 3 (three) times daily as needed for muscle spasms. 30 tablet 0  . diltiazem (TIAZAC) 120 MG  24 hr capsule Take 120 mg by mouth daily.    Marland Kitchen docusate sodium (COLACE) 100 MG capsule Take 100 mg by mouth 2 (two) times daily.    . ferrous sulfate 325 (65 FE) MG tablet Take 325 mg by mouth daily with breakfast.    . folic acid (FOLVITE) 1 MG tablet Take 1 mg by mouth daily.    Marland Kitchen HYDROcodone-acetaminophen (NORCO) 10-325 MG per tablet Take 1 tablet by mouth every 6 (six) hours as needed for pain.    Marland Kitchen ipratropium-albuterol (DUONEB) 0.5-2.5 (3) MG/3ML SOLN Take 3 mLs by nebulization 2 (two) times daily.    . isosorbide mononitrate (IMDUR) 30 MG 24 hr tablet Take 30 mg by mouth daily.    Marland Kitchen levothyroxine (SYNTHROID, LEVOTHROID) 50 MCG tablet Take 50 mcg by mouth daily before breakfast.    . Linaclotide (LINZESS) 290 MCG CAPS Take 290 capsules by mouth daily.    Marland Kitchen loratadine (CLARITIN) 10 MG tablet  Take 10 mg by mouth daily.    . meloxicam (MOBIC) 7.5 MG tablet Take 7.5 mg by mouth daily.    . methotrexate 25 MG/ML SOLN Take 15 mg by mouth once a week.    . mirtazapine (REMERON) 15 MG tablet Take 15 mg by mouth at bedtime.    Marland Kitchen omeprazole (PRILOSEC) 20 MG capsule Take 20 mg by mouth daily.    . pantoprazole (PROTONIX) 20 MG tablet Take 20 mg by mouth daily.    . tamsulosin (FLOMAX) 0.4 MG CAPS capsule Take 0.4 mg by mouth daily after supper.    . vitamin C (ASCORBIC ACID) 500 MG tablet Take 500 mg by mouth daily.    . Vitamin D, Ergocalciferol, (DRISDOL) 50000 units CAPS capsule Take 50,000 Units by mouth every 7 (seven) days.     No current facility-administered medications for this visit.    REVIEW OF SYSTEMS:  [X]  denotes positive finding, [ ]  denotes negative finding Cardiac  Comments:  Chest pain or chest pressure:    Shortness of breath upon exertion:    Short of breath when lying flat:    Irregular heart rhythm:        Vascular    Pain in calf, thigh, or hip brought on by ambulation: x   Pain in feet at night that wakes you up from your sleep:  x   Blood clot in your veins:    Leg swelling:         Pulmonary    Oxygen at home:    Productive cough:     Wheezing:         Neurologic    Sudden weakness in arms or legs:     Sudden numbness in arms or legs:     Sudden onset of difficulty speaking or slurred speech:    Temporary loss of vision in one eye:     Problems with dizziness:         Gastrointestinal    Blood in stool:     Vomited blood:         Genitourinary    Burning when urinating:     Blood in urine:        Psychiatric    Major depression:         Hematologic    Bleeding problems:    Problems with blood clotting too easily:        Skin    Rashes or ulcers:        Constitutional  Fever or chills:      PHYSICAL EXAM: Filed Vitals:   10/28/15 0907  BP: 94/55  Pulse: 76  Temp: 97 F (36.1 C)  TempSrc: Oral  Height: 5\' 4"  (1.626 m)   Weight: 145 lb (65.772 kg)  SpO2: 100%    GENERAL: The patient is a well-nourished male, in no acute distress. The vital signs are documented above. sitting in a wheelchair CARDIAC: There is a regular rate and rhythm.  VASCULAR:  Radial and palpable femoral pulses. Absent popliteal and distal pulses bilaterally PULMONARY: There is good air exchange bilaterally without wheezing or rales. ABDOMEN: Soft and non-tender with normal pitched bowel sounds.  MUSCULOSKELETAL:  Severe deformities in both hands and both knees related to severe arthritis. NEUROLOGIC: No focal weakness or paresthesias are detected. SKIN:  Open ulceration in his right second toe with gangrenous changes on to his forefoot. The joint space and tendons are exposed in his toe itself. PSYCHIATRIC: The patient has a normal affect.  DATA:  I do have noninvasive studies which I reviewed from an outlying noninvasive lab. This was from 10/21/2015. She has evidence of superficial femoral and tibial disease.  By hand-held Doppler he does have audible flow at the dorsalis pedis and posterior tibial but this is dampened  MEDICAL ISSUES:  Gangrenous changes right second toe extending onto his forefoot. Had a very long discussion with the patient and his grandson post present. With his degree of arthritis and inability to straighten his knee, he has essentially bed to chair transfer. He does not walk. He does have better ability to straighten his left leg. I explained that have feel that aggressive attempts with arteriography and femoral-popliteal bypass and transmetatarsal amputation with still high risk for higher level of amputation would be inappropriate with his nonambulatory state. I explained that even if he did not have major complications from this aggressive approach he would be at high risk for amputation. If he was able to have a transmetatarsal amputation heel, still would not be able to walk due to his severe arthritis. For  these reasons I've recommended primary above and knee amputation. Explained that this has a very high likelihood of being able to heal. Spleen that he would have minimal impact on his quality of life since he is bed to chair assist currently and would still be able to pivot on his left leg. Most importantly it would alleviate the severe pain that he is currently having in his right foot. He understands he and his grandson wish to proceed next week. He will be admitted to [facility] hospital on the day of surgery and should be able to be discharged back to the nursing facility after several day hospitalization   Elior Robinette [facility] Vein Specialists of [place]: [phone]

## 2015-11-05 NOTE — Progress Notes (Addendum)
CSW received call from physician that patient is from [facility] and plan is to return when ready  Unable to assess directly with patient or family at this time but made referral to facility and informed of possible weekend admit.  CSW will continue to follow  Domenica Reamer, [place] Social Worker [phone]

## 2015-11-05 NOTE — Interval H&P Note (Signed)
History and Physical Interval Note:  11/05/2015 10:06 AM  John Plowman Sr.  has presented today for surgery, with the diagnosis of Peripheral Vascular Disease with nonhealing ulcer right second toe  I70.235  The various methods of treatment have been discussed with the patient and family. After consideration of risks, benefits and other options for treatment, the patient has consented to  Procedure(s): AMPUTATION ABOVE KNEE (Right) as a surgical intervention .  The patient's history has been reviewed, patient examined, no change in status, stable for surgery.  I have reviewed the patient's chart and labs.  Questions were answered to the patient's satisfaction.     Curt Jews

## 2015-11-05 NOTE — H&P (Signed)
[facility] History and Physical  John Sibayan Sr. [id number] DOB: 1924/08/31 DOA: 11/05/2015  Referring physician: Dr Donnetta Hutching - Vascular PCP: Octavio Graves, DO   Chief Complaint: RLE chronic wound here for elective AKA  HPI: John Kishbaugh Sr. is a 82 y.o. male  Patient evaluated in the preop area on day of admission for elective right AKA.  Patient with history of nonhealing right ulcer of the second toe. Patient with ongoing aggressive outpatient therapies which have been unfruitful. Patient states to have pain and swelling in the right lower 70 which is constant and getting worse. Denies any recent fevers, chest pain, shortness breath, palpitations, nausea, vomiting, diaphoresis, diarrhea, constipation, dysuria, frequency. Mild cough at baseline.  Review of Systems:  Constitutional:  No weight loss, night sweats, Fevers, chills HEENT:  No headaches, Difficulty swallowing,Tooth/dental problems,Sore throat, Cardio-vascular:  No chest pain, Orthopnea, PND, dizziness, palpitations  GI:  No heartburn, indigestion, abdominal pain, nausea, vomiting, diarrhea, change in bowel habits, loss of appetite  Resp:   No shortness of breath with exertion or at rest. No excess mucus, no productive cough, No non-productive cough, No coughing up of blood.No change in color of mucus.No wheezing.No chest wall deformity  Skin:  no rash or lesions.  GU:  no dysuria, change in color of urine, no urgency or frequency. No flank pain.  Musculoskeletal:  As Per HPI Psych:  No change in mood or affect. No depression or anxiety. No memory loss.  Neuro:  No change in sensation, unilateral strength, or cognitive abilities  All other systems were reviewed and are negative.  Past Medical History  Diagnosis Date  . GERD (gastroesophageal reflux disease)   . COPD (chronic obstructive pulmonary disease) ([place])   . Peripheral vascular disease ([place])   . Hypertension   . Coronary artery disease   .  Gout   . Arthritis     Rheumatoid  . Cancer [facility])     Prostate  . BPH (benign prostatic hyperplasia)   . Depression   . Hypothyroidism   . Angina pectoris, unspecified [facility])     Per LPN at [facility]   Past Surgical History  Procedure Laterality Date  . Hernia repair  1960's  . Poor historian      Patient poor historian on surgical procedures;spoke to Dr. Landis Gandy office concerning medical history.  . Cataract extraction w/phaco Left 04/28/2013    Procedure: CATARACT EXTRACTION PHACO AND INTRAOCULAR LENS PLACEMENT (IOC);  Surgeon: Tonny Branch, MD;  Location: [facility];  Service: Ophthalmology;  Laterality: Left;  CDE:21.0  . Transurethral resection of prostate N/A 05/27/2013    Procedure: TRANSURETHRAL RESECTION OF THE PROSTATE (TURP);  Surgeon: Marissa Nestle, MD;  Location: [facility];  Service: Urology;  Laterality: N/A;  . Cardiac catheterization      date unknown per LPN at [place] History:  reports that he has quit smoking. His smoking use included Cigars and Cigarettes. He has a 5 pack-year smoking history. He does not have any smokeless tobacco history on file. He reports that he does not drink alcohol or use illicit drugs.  Allergies  Allergen Reactions  . Doxycycline Nausea And Vomiting    Family History  Problem Relation Age of Onset  . Cancer Mother   . Heart disease Mother     before age 54  . Cancer Father   . Cancer Sister   . Heart disease Sister     before age 16  . Cancer Brother   .  Heart disease Brother     before at 45  . Cancer Brother   . Cancer Brother   . Cancer Sister   . Cancer Brother   . Cancer Brother      Prior to Admission medications   Medication Sig Start Date End Date Taking? Authorizing Provider  acetaminophen (TYLENOL) 500 MG tablet Take 1,000 mg by mouth every 6 (six) hours as needed for moderate pain or headache.   Yes Historical Provider, MD  albuterol (PROVENTIL HFA;VENTOLIN HFA) 108 (90 BASE) MCG/ACT inhaler  Inhale 2 puffs into the lungs every 6 (six) hours as needed for wheezing.   Yes Historical Provider, MD  aspirin 81 MG chewable tablet Chew 1 tablet (81 mg total) by mouth daily. 05/28/13  Yes Enzo Montgomery, MD  diltiazem (TIAZAC) 120 MG 24 hr capsule Take 120 mg by mouth daily.   Yes Historical Provider, MD  HYDROcodone-acetaminophen (NORCO) 10-325 MG per tablet Take 1 tablet by mouth every 6 (six) hours as needed for pain.   Yes Historical Provider, MD  isosorbide mononitrate (IMDUR) 30 MG 24 hr tablet Take 15 mg by mouth daily.    Yes Historical Provider, MD  levothyroxine (SYNTHROID, LEVOTHROID) 50 MCG tablet Take 50 mcg by mouth daily before breakfast.   Yes Historical Provider, MD  loratadine (CLARITIN) 10 MG tablet Take 10 mg by mouth daily.   Yes Historical Provider, MD  methotrexate 25 MG/ML SOLN Take 15 mg by mouth once a week.   Yes Historical Provider, MD  mirtazapine (REMERON) 15 MG tablet Take 15 mg by mouth at bedtime.   Yes Historical Provider, MD  pantoprazole (PROTONIX) 20 MG tablet Take 20 mg by mouth daily.   Yes Historical Provider, MD  tamsulosin (FLOMAX) 0.4 MG CAPS capsule Take 0.4 mg by mouth daily after supper.   Yes Historical Provider, MD  guaiFENesin-dextromethorphan (ROBITUSSIN DM) 100-10 MG/5ML syrup Take 10 mLs by mouth every 4 (four) hours as needed for cough.    Historical Provider, MD  magnesium hydroxide (MILK OF MAGNESIA) 400 MG/5ML suspension Take 30 mLs by mouth daily as needed for mild constipation.    Historical Provider, MD  nitroGLYCERIN (NITROSTAT) 0.4 MG SL tablet Place 0.4 mg under the tongue every 5 (five) minutes as needed for chest pain.    Historical Provider, MD  Polyethyl Glycol-Propyl Glycol (SYSTANE) 0.4-0.3 % SOLN Apply 1 drop to eye every 2 (two) hours as needed (dry eyes).    Historical Provider, MD  promethazine (PHENERGAN) 25 MG tablet Take 25 mg by mouth every 4 (four) hours as needed for nausea or vomiting.    Historical Provider, MD    Physical Exam: Filed Vitals:   11/05/15 0956 11/05/15 1001  BP:  138/81  Pulse:  91  Temp:  97.9 F (36.6 C)  TempSrc:  Oral  Resp:  16  Height: 5\' 4"  (1.626 m)   Weight: 65.772 kg (145 lb)   SpO2:  100%    Wt Readings from Last 3 Encounters:  11/05/15 65.772 kg (145 lb)  10/28/15 65.772 kg (145 lb)  06/03/13 59.875 kg (132 lb)    General:  Appears calm and comfortable Eyes:  PERRL, EOMI, normal lids, iris ENT:  grossly normal hearing, lips & tongue Neck:  no LAD, masses or thyromegaly Cardiovascular:  RRR, II/VI systolic murmur. Trace-1+ RLE edema w/ trace LLE edema.  Respiratory:  CTA bilaterally, no w/r/r. Normal respiratory effort. Abdomen:  soft, ntnd Skin: Maceration, induration and erythema of the dorsum of  the right lower extremity with tenderness to palpation. Musculoskeletal:  grossly normal tone BUE/BLE Psychiatric:  grossly normal mood and affect, speech fluent and appropriate Neurologic:  CN 2-12 grossly intact, moves all extremities in coordinated fashion.          Labs on Admission:  Basic Metabolic Panel:  Recent Labs Lab 11/05/15 1018  NA 139  K 4.6  CL 103  CO2 23  GLUCOSE 89  BUN 35*  CREATININE 1.26*  CALCIUM 9.7   Liver Function Tests: No results for input(s): AST, ALT, ALKPHOS, BILITOT, PROT, ALBUMIN in the last 168 hours. No results for input(s): LIPASE, AMYLASE in the last 168 hours. No results for input(s): AMMONIA in the last 168 hours. CBC:  Recent Labs Lab 11/05/15 1018  WBC 6.1  HGB 9.2*  HCT 29.5*  MCV 88.6  PLT 285   Cardiac Enzymes: No results for input(s): CKTOTAL, CKMB, CKMBINDEX, TROPONINI in the last 168 hours.  BNP (last 3 results) No results for input(s): BNP in the last 8760 hours.  ProBNP (last 3 results) No results for input(s): PROBNP in the last 8760 hours.   CREATININE: 1.26 mg/dL ABNORMAL (11/05/15 1018) Estimated creatinine clearance - 33.3 mL/min  CBG: No results for input(s): GLUCAP in  the last 168 hours.  Radiological Exams on Admission: No results found.    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 Surgeon Dr. Donnetta Hutching. - Tele - Pain/Surgical management per Dr Donnetta Hutching.  HTN: - continue imdur, Dilt post-op  CKD: Cr 1.28 at baseline - BMET in am  Anemia: Hgb 9.2 at baseline. Normocytic. Suspect due to chronic disease and CKD. No evidence of previous workup - Anemia panel - post op CBC  RA: - continue methotrexate post-op  Constipation: baseline problem for pt. Anticipate worsening due to narcotics from surgery.  - continue home milk of mag PRN - Sennakot scheduled  Hypothyroid: - continue synthroid  BPH: h/o TURP. No cancer as chart previously indicated.  - continue flomax post-op  GERD: - PPI IV   Code Status: FULL  DVT Prophylaxis: SCD Family Communication: Wife and son Disposition Plan: Pending Improvement    MERRELL, DAVID J, MD Family Medicine [facility] [email or web address] Password [email or web address]

## 2015-11-05 NOTE — Progress Notes (Signed)
Maureen PA for Dr. Donnetta Hutching called and informed about admit order, will call primary for orders.

## 2015-11-05 NOTE — Anesthesia Procedure Notes (Signed)
Procedure Name: Intubation Date/Time: 11/05/2015 12:40 PM Performed by: Tressia Miners LEFFEW Pre-anesthesia Checklist: Patient identified, Patient being monitored, Timeout performed, Emergency Drugs available and Suction available Patient Re-evaluated:Patient Re-evaluated prior to inductionOxygen Delivery Method: Circle System Utilized Preoxygenation: Pre-oxygenation with 100% oxygen Intubation Type: IV induction Ventilation: Mask ventilation without difficulty and Oral airway inserted - appropriate to patient size Laryngoscope Size: Mac and 3 Grade View: Grade II Tube type: Oral Tube size: 7.5 mm Number of attempts: 1 Airway Equipment and Method: Stylet Placement Confirmation: ETT inserted through vocal cords under direct vision,  positive ETCO2 and breath sounds checked- equal and bilateral Secured at: 23 cm Tube secured with: Tape Dental Injury: Teeth and Oropharynx as per pre-operative assessment

## 2015-11-05 NOTE — Clinical Social Work Placement (Signed)
   CLINICAL SOCIAL WORK PLACEMENT  NOTE  Date:  11/05/2015  Patient Details  Name: John Scheirer Sr. MRN: [id number] Date of Birth: 1924/08/31  Clinical Social Work is seeking post-discharge placement for this patient at the [place] level of care (*CSW will initial, date and re-position this form in  chart as items are completed):  Yes   Patient/family provided with [facility] Work Department's list of facilities offering this level of care within the geographic area requested by the patient (or if unable, by the patient's family).  Yes   Patient/family informed of their freedom to choose among providers that offer the needed level of care, that participate in Medicare, Medicaid or managed care program needed by the patient, have an available bed and are willing to accept the patient.  Yes   Patient/family informed of [facility]'s ownership interest in [facility] and [facility], as well as of the fact that they are under no obligation to receive care at these facilities.  PASRR submitted to EDS on       PASRR number received on       Existing PASRR number confirmed on 11/05/15     FL2 transmitted to all facilities in geographic area requested by pt/family on 11/05/15     FL2 transmitted to all facilities within larger geographic area on       Patient informed that his/her managed care company has contracts with or will negotiate with certain facilities, including the following:            Patient/family informed of bed offers received.  Patient chooses bed at       Physician recommends and patient chooses bed at      Patient to be transferred to   on  .  Patient to be transferred to facility by       Patient family notified on   of transfer.  Name of family member notified:        PHYSICIAN Please sign FL2     Additional Comment:    _______________________________________________ Cranford Mon, LCSW 11/05/2015, 2:43  PM

## 2015-11-05 NOTE — NC FL2 (Signed)
[place] LEVEL OF CARE SCREENING TOOL     IDENTIFICATION  Patient Name: John Ahlquist Sr. Birthdate: 1924-04-28 Sex: male Admission Date (Current Location): 11/05/2015  [facility] and [place] Number:  [employer] and Address:  [facility], [place] [address] [place], [place] [address]      Provider Number: [id number]  Attending Physician Name and Address:  Rosetta Posner, MD  Relative Name and Phone Number:       Current Level of Care: Hospital Recommended Level of Care: [place] Prior Approval Number:    Date Approved/Denied:   PASRR Number: [id number]  Discharge Plan: SNF    Current Diagnoses: Patient Active Problem List   Diagnosis Date Noted  . PVD (peripheral vascular disease) ([place]) 11/05/2015  . Rheumatoid arthritis ([place]) 11/05/2015  . CKD (chronic kidney disease) stage 3, GFR 30-59 ml/min 11/05/2015  . Normocytic anemia 11/05/2015  . Constipation 11/05/2015  . Essential hypertension 11/05/2015  . BPH (benign prostatic hyperplasia) 11/05/2015  . GERD (gastroesophageal reflux disease) 11/05/2015  . Toe infection 10/19/2015  . Open wound of toe 10/19/2015  . Rheumatoid arthritis involving both knees with positive rheumatoid factor ([place]) 10/19/2015    Orientation RESPIRATION BLADDER Height & Weight     Self, Time, Situation, Place  Normal Continent Weight: 145 lb (65.772 kg) Height:  5\' 4"  (162.6 cm)  BEHAVIORAL SYMPTOMS/MOOD NEUROLOGICAL BOWEL NUTRITION STATUS      Continent Diet (see DC summary)  AMBULATORY STATUS COMMUNICATION OF NEEDS Skin   Extensive Assist Verbally Surgical wounds                       Personal Care Assistance Level of Assistance  Bathing, Dressing Bathing Assistance: Limited assistance   Dressing Assistance: Limited assistance     Functional Limitations Info             SPECIAL CARE FACTORS FREQUENCY  PT (By licensed PT), OT (By licensed OT)     PT  Frequency: 5/wk OT Frequency: 5/wk            Contractures      Additional Factors Info  Code Status, Allergies Code Status Info: FULL Allergies Info: Doxycycline           Current Medications (11/05/2015):  This is the current hospital active medication list Current Facility-Administered Medications  Medication Dose Route Frequency Provider Last Rate Last Dose  . 0.45 % sodium chloride infusion   Intravenous Continuous Waldemar Dickens, MD      . 0.9 %  sodium chloride infusion   Intravenous Continuous Rosetta Posner, MD      . aspirin chewable tablet 81 mg  81 mg Oral Daily Waldemar Dickens, MD      . chlorhexidine (HIBICLENS) 4 % liquid 4 application  60 mL Topical Once Rosetta Posner, MD       And  . Derrill Memo ON 11/06/2015] chlorhexidine (HIBICLENS) 4 % liquid 4 application  60 mL Topical Once Rosetta Posner, MD      . Derrill Memo ON 11/06/2015] diltiazem (TIAZAC) 24 hr capsule 120 mg  120 mg Oral Daily Waldemar Dickens, MD      . fentaNYL (SUBLIMAZE) 100 MCG/2ML injection           . fentaNYL (SUBLIMAZE) injection 25-50 mcg  25-50 mcg Intravenous Q5 min PRN Jillyn Hidden, MD   25 mcg at 11/05/15 1417  . hydrALAZINE (APRESOLINE) injection 5-10  mg  5-10 mg Intravenous Q4H PRN Waldemar Dickens, MD      . Derrill Memo ON 11/06/2015] isosorbide mononitrate (IMDUR) 24 hr tablet 15 mg  15 mg Oral Daily Waldemar Dickens, MD      . labetalol (NORMODYNE,TRANDATE) injection 10 mg  10 mg Intravenous Q10 min PRN Ulyses Amor, PA-C      . lactated ringers infusion   Intravenous Continuous Jillyn Hidden, MD 10 mL/hr at 11/05/15 1020    . [START ON 11/06/2015] levothyroxine (SYNTHROID, LEVOTHROID) tablet 50 mcg  50 mcg Oral QAC breakfast Waldemar Dickens, MD      . magnesium hydroxide (MILK OF MAGNESIA) suspension 30 mL  30 mL Oral Daily PRN Waldemar Dickens, MD      . metoprolol (LOPRESSOR) injection 2-5 mg  2-5 mg Intravenous Q2H PRN Ulyses Amor, PA-C      . mirtazapine (REMERON) tablet 15 mg  15 mg Oral QHS Waldemar Dickens, MD      . morphine 2 MG/ML injection 2-5 mg  2-5 mg Intravenous Q1H PRN Ulyses Amor, PA-C      . mupirocin ointment (BACTROBAN) 2 %           . ondansetron (ZOFRAN) 4 MG/2ML injection           . ondansetron (ZOFRAN) injection 4 mg  4 mg Intravenous Once PRN Jillyn Hidden, MD      . ondansetron [facility]) tablet 4 mg  4 mg Oral Q6H PRN Waldemar Dickens, MD       Or  . ondansetron [facility]) injection 4 mg  4 mg Intravenous Q6H PRN Waldemar Dickens, MD      . oxyCODONE-acetaminophen (PERCOCET/ROXICET) 5-325 MG per tablet 1-2 tablet  1-2 tablet Oral Q4H PRN Ulyses Amor, PA-C      . promethazine (PHENERGAN) tablet 25 mg  25 mg Oral Q4H PRN Waldemar Dickens, MD      . senna [facility]) tablet 8.6 mg  1 tablet Oral BID Waldemar Dickens, MD      . tamsulosin [facility]) capsule 0.4 mg  0.4 mg Oral QPC supper Waldemar Dickens, MD         Discharge Medications: Please see discharge summary for a list of discharge medications.  Relevant Imaging Results:  Relevant Lab Results:   Additional Information SS#: [id number]  Cranford Mon, [place]

## 2015-11-05 NOTE — Anesthesia Preprocedure Evaluation (Addendum)
Anesthesia Evaluation  Patient identified by MRN, date of birth, ID band Patient awake    Reviewed: Allergy & Precautions, H&P , NPO status , Patient's Chart, lab work & pertinent test results  Airway Mallampati: I  TM Distance: >3 FB     Dental  (+) Edentulous Upper, Edentulous Lower   Pulmonary COPD, Current Smoker, former smoker,    breath sounds clear to auscultation       Cardiovascular hypertension, Pt. on medications + CAD and + Peripheral Vascular Disease   Rhythm:Regular Rate:Normal  Echo 2012 with no significant valve abnormalities Stress 2012 with normal EF at 55%, no reversible ischemia   Neuro/Psych Depression    GI/Hepatic GERD  Medicated and Controlled,  Endo/Other  Hypothyroidism   Renal/GU      Musculoskeletal  (+) Arthritis ,   Abdominal   Peds  Hematology   Anesthesia Other Findings Took CCB yesterday  Patient is thin, frail appearing with gangrenous looking right lower extremity  Reproductive/Obstetrics                          Anesthesia Physical  Anesthesia Plan  ASA: IV  Anesthesia Plan: General   Post-op Pain Management:    Induction: Intravenous  Airway Management Planned: Oral ETT  Additional Equipment:   Intra-op Plan:   Post-operative Plan: Extubation in OR  Informed Consent: I have reviewed the patients History and Physical, chart, labs and discussed the procedure including the risks, benefits and alternatives for the proposed anesthesia with the patient or authorized representative who has indicated his/her understanding and acceptance.     Plan Discussed with: Anesthesiologist, CRNA and Surgeon  Anesthesia Plan Comments: (Patient with nausea in preop holding, will give zofran and place ETT for procedure  Ketamine for multimodal analgesia adjunct)      Anesthesia Quick Evaluation

## 2015-11-05 NOTE — Op Note (Signed)
    OPERATIVE REPORT  DATE OF SURGERY: 11/05/2015  PATIENT: John Plowman Sr., 82 y.o. male MRN: [id number]  DOB: 1923-12-10  PRE-OPERATIVE DIAGNOSIS:  Gangrene right foot  POST-OPERATIVE DIAGNOSIS:  Same  PROCEDURE:  Right above-knee amputation  SURGEON:  Curt Jews, M.D.  PHYSICIAN ASSISTANT:  Gerri Lins PA-C  ANESTHESIA:   Gen.  EBL:  150 ml  Total I/O In: 1000 [I.V.:1000] Out: 150 [Blood:150]  BLOOD ADMINISTERED:  none  DRAINS:  none  SPECIMEN:   Right above-knee amputation  COUNTS CORRECT:  YES  PLAN OF CARE:  PACU   PATIENT DISPOSITION:  PACU - hemodynamically stable  PROCEDURE DETAILS:  patient is an 82 year old gentleman who presented to our office with gangrene in his right foot. He had a severe rest pain and progressive tissue loss as well. He is nonambulatory due to severe arthritis and has severe contracture in his right hip and knee. I recommended above-knee amputation early and for treatment of his infection.   GI was taken to the operating room where the area of his right thigh and right leg sterile fashion. A fishmouth incision distal thigh carried down through the subcutaneous tissue and muscle with electrocautery. This was carried down to the level of the femur. He superficial femoral artery and vein were ligated smaller tributary vessels were controlled with electrocautery and larger tributary vessel divided between hemostats stats. Periosteum was elevated off the femur and the femur was divided with a Gigli saw. The bone edges were smoothed with bone rasp. The wound was irrigated with saline and hemostasis was obtained left cautery. The posterior fascia was  Closed the anterior fascia with interrupted 0 Vicryl figure-of-eight sutures. The skin was closed with skin clips. Sterile dressing and Ace wrap were applied the patient was transferred to the recovery room in stable condition   Curt Jews, M.D. 11/05/2015 2:39 PM

## 2015-11-06 DIAGNOSIS — K5909 Other constipation: Secondary | ICD-10-CM

## 2015-11-06 LAB — CBC
HEMATOCRIT: 28.5 % — AB (ref 39.0–52.0)
HEMOGLOBIN: 8.7 g/dL — AB (ref 13.0–17.0)
MCH: 27.5 pg (ref 26.0–34.0)
MCHC: 30.5 g/dL (ref 30.0–36.0)
MCV: 90.2 fL (ref 78.0–100.0)
Platelets: 268 10*3/uL (ref 150–400)
RBC: 3.16 MIL/uL — ABNORMAL LOW (ref 4.22–5.81)
RDW: 13.5 % (ref 11.5–15.5)
WBC: 7.3 10*3/uL (ref 4.0–10.5)

## 2015-11-06 LAB — BASIC METABOLIC PANEL
ANION GAP: 11 (ref 5–15)
BUN: 23 mg/dL — ABNORMAL HIGH (ref 6–20)
CHLORIDE: 103 mmol/L (ref 101–111)
CO2: 23 mmol/L (ref 22–32)
Calcium: 8.4 mg/dL — ABNORMAL LOW (ref 8.9–10.3)
Creatinine, Ser: 1.15 mg/dL (ref 0.61–1.24)
GFR calc Af Amer: >60 mL/min (ref >60)
GFR calc non Af Amer: 54 mL/min — ABNORMAL LOW (ref >60)
GLUCOSE: 73 mg/dL (ref 65–99)
POTASSIUM: 4.5 mmol/L (ref 3.5–5.1)
Sodium: 137 mmol/L (ref 135–145)

## 2015-11-06 MED ORDER — SENNA 8.6 MG PO TABS: 1.0000 | ORAL_TABLET | Freq: Every day | ORAL | Status: AC

## 2015-11-06 MED ORDER — HYDROCODONE-ACETAMINOPHEN 10-325 MG PO TABS: 1.0000 | ORAL_TABLET | Freq: Four times a day (QID) | ORAL | Status: AC | PRN

## 2015-11-06 MED ORDER — DOCUSATE SODIUM 100 MG PO CAPS: 100.0000 mg | ORAL_CAPSULE | Freq: Every day | ORAL | Status: AC

## 2015-11-06 NOTE — Progress Notes (Signed)
Per MD, patient staying in hospital until Monday. Patient will return to [facility] when stable.  John Schmidt LCSWA [phone]

## 2015-11-06 NOTE — Progress Notes (Addendum)
[facility] [place]  Subjective  - Confused, but seems to be baseline.   Objective 113/49 16 98.4 F (36.9 C) (Oral) 16 100%  Intake/Output Summary (Last 24 hours) at 11/06/15 0753 Last data filed at 11/06/15 0523  Gross per 24 hour  Intake   1000 ml  Output    850 ml  Net    150 ml    Right stump incision Clean and dry. Dressing was off secondary to patient moving constantly Clean dry dressing applied   Assessment/Planning: POD # 1 Left AKA  Disposition stable for discharge to SNF today F/U in 4 weeks for staple ramoval  COLLINS, EMMA [facility] 11/06/2015 7:53 AM --  Laboratory Lab Results:  Recent Labs  11/05/15 1018  WBC 6.1  HGB 9.2*  HCT 29.5*  PLT 285   BMET  Recent Labs  11/05/15 1018  NA 139  K 4.6  CL 103  CO2 23  GLUCOSE 89  BUN 35*  CREATININE 1.26*  CALCIUM 9.7    COAG Lab Results  Component Value Date   INR 1.07 11/05/2015   No results found for: PTT   I agree with the above.  I have seen and evaluated the patient.  Wound appears to be healing appropriately.  He is scheduled to discharge to [facility] soon.  He'll follow-up in 4 weeks for staple removal with Dr. early.  Annamarie Major

## 2015-11-06 NOTE — Evaluation (Signed)
Physical Therapy Evaluation Patient Details Name: John Cull Sr. MRN: [id number] DOB: 06/01/1924 Today's Date: 11/06/2015   History of Present Illness  Patient is an 82 yo male admitted 11/05/15 from [place] with non-healing wounds RLE.  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   PMH:  non-healing ulcers RLE, COPD, PVD, HTN, CAD, arthritis  Clinical Impression  Patient presents with problems listed below.  Will benefit from acute PT to maximize functional mobility prior to discharge.  Recommend patient return to SNF at discharge for continued therapy.    Follow Up Recommendations SNF;Supervision/Assistance - 24 hour    Equipment Recommendations  Wheelchair (measurements PT);Wheelchair cushion (measurements PT)    Recommendations for Other Services       Precautions / Restrictions Precautions Precautions: Fall Precaution Comments: Patient trying to climb out of bed with LLE over rail as PT entered room. Restrictions Weight Bearing Restrictions: No      Mobility  Bed Mobility Overal bed mobility: Needs Assistance Bed Mobility: Supine to Sit;Sit to Supine     Supine to sit: Min assist Sit to supine: Min assist   General bed mobility comments: Patient with LLE over rail.  Removed rail.  Provided min assist to raise trunk to upright sitting position.  Once upright, patient able to maintain sitting balance with min guard assist for safety.  Patient sat EOB x 5 minutes.  Patient pulling on mitts - able to remove.  Patient returned to supine with min assist.  Reapplied mitts to both hands.  Rail returned to up position.  Bed alarm on.  Transfers                    Ambulation/Gait                Stairs            Wheelchair Mobility    Modified Rankin (Stroke Patients Only)       Balance Overall balance assessment: Needs assistance Sitting-balance support: No upper extremity supported;Feet supported Sitting balance-Leahy Scale: Fair                                        Pertinent Vitals/Pain Pain Assessment: No/denies pain (Patient states no pain - repeated during session)    Home Living Family/patient expects to be discharged to:: Skilled nursing facility                      Prior Function Level of Independence: Needs assistance   Gait / Transfers Assistance Needed: Unsure.  Appears non-ambulatory due to contractures LLE.  ADL's / Homemaking Assistance Needed: Assist for all ADL's        Hand Dominance        Extremity/Trunk Assessment   Upper Extremity Assessment: RUE deficits/detail;LUE deficits/detail RUE Deficits / Details: Decreased hand function - able to open hand and hold on to object.     LUE Deficits / Details: Minimal hand movement   Lower Extremity Assessment: RLE deficits/detail;LLE deficits/detail RLE Deficits / Details: New AKA LLE Deficits / Details: Flexion contractures in hip and knee.  Able to move LLE against gravity     Communication   Communication: Expressive difficulties (Difficult to understand)  Cognition Arousal/Alertness: Awake/alert Behavior During Therapy: Restless;Impulsive Overall Cognitive Status: No family/caregiver present to determine baseline cognitive functioning (Confused)  General Comments      Exercises General Exercises - Lower Extremity Ankle Circles/Pumps: AROM;Left;5 reps;Seated Long Arc Quad: AROM;Left;5 reps;Seated;Limitations Long [employer] Limitations: Patient unable to fully extend Lt knee.      Assessment/Plan    PT Assessment Patient needs continued PT services  PT Diagnosis Generalized weakness;Altered mental status   PT Problem List Decreased strength;Decreased range of motion;Decreased activity tolerance;Decreased balance;Decreased mobility;Decreased cognition;Decreased knowledge of use of DME;Decreased safety awareness  PT Treatment Interventions Functional mobility training;Therapeutic  activities;Therapeutic exercise;Balance training;Cognitive remediation;Patient/family education   PT Goals (Current goals can be found in the Care Plan section) Acute Rehab PT Goals Patient Stated Goal: None stated PT Goal Formulation: Patient unable to participate in goal setting Time For Goal Achievement: 11/20/15 Potential to Achieve Goals: Fair    Frequency Min 2X/week   Barriers to discharge        Co-evaluation               End of Session   Activity Tolerance: Patient tolerated treatment well;Treatment limited secondary to agitation Patient left: in bed;with call bell/phone within reach;with bed alarm set;with restraints reapplied Nurse Communication: Other (comment) (Patient in bed with alarm on)         Time: [id number] PT Time Calculation (min) (ACUTE ONLY): 10 min   Charges:   PT Evaluation $PT Eval Moderate Complexity: 1 Procedure     PT G CodesDespina Pole 19-Nov-2015, 1:17 PM Carita Pian. Sanjuana Kava, [place] Pager [phone]

## 2015-11-06 NOTE — Discharge Summary (Addendum)
Physician Discharge Summary  John Oh Sr. [id number] DOB: 04-Jan-1924 DOA: 11/05/2015  PCP: Octavio Graves, DO  Admit date: 11/05/2015 Discharge date: 11/08/2015  Time spent: Greater than 30 minutes  Recommendations for Outpatient Follow-up:  1. Back to Skilled nursing facility 2. Follow up with Dr. Donnetta Hutching in 4 weeks 3. Keep stump dressing in place. Change as needed for soilage.   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)   Discharge Condition: Stable  Diet recommendation: General  Filed Weights   11/05/15 0956  Weight: 65.772 kg (145 lb)    History of present illness:  82 y.o. male, who is seen today for evaluation of nonhealing right second toe ulcer. He is here today with his grandson who cares for him. He is a resident at the [facility] facility. His grandson reports that this ulceration over his toes when present for proximally 6 months. He has had various debridement and local wound care to this. I continues to have issues with nonhealing. He has severe arthritis with severe rotatory deformity of both hands and both lower extremities. He is unable to straighten his right knee past approximately 45. He reports that he can stand to transfer this seems doubtful in his current state. His grandson reports that he has had significant deterioration in his overall status over the past several months. He is somewhat hard of hearing but does seem to understand our discussion. Reports significant pain in his right foot reports this is worse at night.   Hospital Course:   Patient had right AKA per vascular surgery. Hospitalists were asked to admit postoperatively. He has remained stable and vascular surgery has cleared him for discharge back to skilled nursing facility.  Procedures:  Right above-the-knee amputation  Consultations:  Vascular surgery  Discharge Exam: Filed Vitals:   11/05/15 2049 11/06/15 0521  BP: 152/85 113/49  Pulse: 10 16  Temp: 98 F (36.7 C) 98.4 F (36.9 C)  Resp: 18 16    General: Alert. Cooperative. Hard of hearing. Cardiovascular: Regular rate rhythm without murmurs gallops rubs Respiratory: Clear to auscultation bilaterally without wheezes rhonchi or rales Extremities: Right AKA dressing clean dry and intact.  Discharge Instructions   Discharge Instructions    Diet general    Complete by:  As directed      Leave dressing on - Keep it clean, dry, and intact until clinic visit    Complete by:  As directed      Walk with assistance    Complete by:  As directed           Current Discharge Medication List    START taking these medications   Details  docusate sodium (COLACE) 100 MG capsule Take 1 capsule (100 mg total) by mouth daily. Qty: 10 capsule, Refills: 0    senna (SENOKOT) 8.6 MG TABS tablet Take 1 tablet (8.6 mg total) by mouth daily. Qty: 120 each, Refills: 0      CONTINUE these medications which have CHANGED   Details  HYDROcodone-acetaminophen (NORCO) 10-325 MG tablet Take 1 tablet by mouth every 6 (six) hours as  needed for severe pain. Qty: 20 tablet, Refills: 0      CONTINUE these medications which have NOT CHANGED   Details  acetaminophen (TYLENOL) 500 MG tablet Take 1,000 mg by mouth every 6 (six) hours as needed for moderate pain or headache.    albuterol (PROVENTIL HFA;VENTOLIN HFA) 108 (90 BASE) MCG/ACT inhaler Inhale 2 puffs into the lungs every 6 (six) hours as needed for wheezing.    aspirin 81 MG chewable tablet Chew 1 tablet (81 mg total) by mouth daily. Qty: 30 tablet,  Refills: 1    diltiazem (TIAZAC) 120 MG 24 hr capsule Take 120 mg by mouth daily.    isosorbide mononitrate (IMDUR) 30 MG 24 hr tablet Take 15 mg by mouth daily.     levothyroxine (SYNTHROID, LEVOTHROID) 50 MCG tablet Take 50 mcg by mouth daily before breakfast.    loratadine (CLARITIN) 10 MG tablet Take 10 mg by mouth daily.    methotrexate 25 MG/ML SOLN Take 15 mg by mouth once a week.    mirtazapine (REMERON) 15 MG tablet Take 15 mg by mouth at bedtime.    pantoprazole (PROTONIX) 20 MG tablet Take 20 mg by mouth daily.    tamsulosin (FLOMAX) 0.4 MG CAPS capsule Take 0.4 mg by mouth daily after supper.    guaiFENesin-dextromethorphan (ROBITUSSIN DM) 100-10 MG/5ML syrup Take 10 mLs by mouth every 4 (four) hours as needed for cough.    magnesium hydroxide (MILK OF MAGNESIA) 400 MG/5ML suspension Take 30 mLs by mouth daily as needed for mild constipation.    nitroGLYCERIN (NITROSTAT) 0.4 MG SL tablet Place 0.4 mg under the tongue every 5 (five) minutes as needed for chest pain.    Polyethyl Glycol-Propyl Glycol (SYSTANE) 0.4-0.3 % SOLN Apply 1 drop to eye every 2 (two) hours as needed (dry eyes).    promethazine (PHENERGAN) 25 MG tablet Take 25 mg by mouth every 4 (four) hours as needed for nausea or vomiting.       Allergies  Allergen Reactions  . Doxycycline Nausea And Vomiting   Follow-up Information    Follow up with Curt Jews, MD In 4 weeks.   Specialties:  Vascular Surgery, Cardiology   Why:  Office will call you to arrange your appt (sent)   Contact information:   [place] [address] [phone]        The results of significant diagnostics from this hospitalization (including imaging, microbiology, ancillary and laboratory) are listed below for reference.    Significant Diagnostic Studies: No results found.  Microbiology: Recent Results (from the past 240 hour(s))  Surgical pcr screen     Status: Abnormal   Collection Time: 11/05/15 11:16  AM  Result Value Ref Range Status   MRSA, PCR NEGATIVE NEGATIVE Final   Staphylococcus aureus POSITIVE (A) NEGATIVE Final    Comment:        The Xpert SA Assay (FDA approved for NASAL specimens in patients over 25 years of age), is one component of a comprehensive surveillance program.  Test performance has been validated by [facility] for patients greater than or equal to 22 year old. It is not intended to diagnose infection nor to guide or monitor treatment.      Labs: Basic Metabolic Panel:  Recent Labs Lab 11/05/15 1018  NA 139  K 4.6  CL 103  CO2 23  GLUCOSE 89  BUN 35*  CREATININE 1.26*  CALCIUM 9.7   Liver Function Tests: No results for input(s): AST, ALT,  ALKPHOS, BILITOT, PROT, ALBUMIN in the last 168 hours. No results for input(s): LIPASE, AMYLASE in the last 168 hours. No results for input(s): AMMONIA in the last 168 hours. CBC:  Recent Labs Lab 11/05/15 1018 11/06/15 0816  WBC 6.1 7.3  HGB 9.2* 8.7*  HCT 29.5* 28.5*  MCV 88.6 90.2  PLT 285 268   Cardiac Enzymes: No results for input(s): CKTOTAL, CKMB, CKMBINDEX, TROPONINI in the last 168 hours. BNP: BNP (last 3 results) No results for input(s): BNP in the last 8760 hours.  ProBNP (last 3 results) No results for input(s): PROBNP in the last 8760 hours.  CBG: No results for input(s): GLUCAP in the last 168 hours.     Signed:  Delfina Redwood MD  [facility] 11/06/2015, 9:45 AM

## 2015-11-06 NOTE — Care Management Note (Signed)
Case Management Note  Patient Details  Name: John Cassone Sr. MRN: [id number] Date of Birth: 1923-10-06  Subjective/Objective:                  RLE chronic wound here for elective AKA  Action/Plan: Cm called and spoke with SW @ [phone] about referral and SW active in case and planning for discharge today. CM remains available should discharge plan change.   Expected Discharge Date:  11/06/15               Expected Discharge Plan:  [place]  In-House Referral:  Clinical Social Work  Discharge planning Services  CM Consult  Post Acute Care Choice:    Choice offered to:     DME Arranged:    DME Agency:     HH Arranged:    [place] Agency:     Status of Service:  In process, will continue to follow  Medicare Important Message Given:    Date Medicare IM Given:    Medicare IM give by:    Date Additional Medicare IM Given:    Additional Medicare Important Message give by:     If discussed at [place] of Stay Meetings, dates discussed:    Additional Comments:  Guido Sander, RN 11/06/2015, 12:08 PM

## 2015-11-07 DIAGNOSIS — S91109S Unspecified open wound of unspecified toe(s) without damage to nail, sequela: Secondary | ICD-10-CM

## 2015-11-07 LAB — CBC
HCT: 25.1 % — ABNORMAL LOW (ref 39.0–52.0)
HEMOGLOBIN: 7.9 g/dL — AB (ref 13.0–17.0)
MCH: 28.1 pg (ref 26.0–34.0)
MCHC: 31.5 g/dL (ref 30.0–36.0)
MCV: 89.3 fL (ref 78.0–100.0)
Platelets: 248 10*3/uL (ref 150–400)
RBC: 2.81 MIL/uL — ABNORMAL LOW (ref 4.22–5.81)
RDW: 13.5 % (ref 11.5–15.5)
WBC: 7.4 10*3/uL (ref 4.0–10.5)

## 2015-11-07 LAB — BASIC METABOLIC PANEL
ANION GAP: 13 (ref 5–15)
BUN: 26 mg/dL — ABNORMAL HIGH (ref 6–20)
CALCIUM: 8.3 mg/dL — AB (ref 8.9–10.3)
CHLORIDE: 101 mmol/L (ref 101–111)
CO2: 23 mmol/L (ref 22–32)
Creatinine, Ser: 1.25 mg/dL — ABNORMAL HIGH (ref 0.61–1.24)
GFR calc non Af Amer: 49 mL/min — ABNORMAL LOW (ref >60)
GFR, EST AFRICAN AMERICAN: 57 mL/min — AB (ref >60)
Glucose, Bld: 118 mg/dL — ABNORMAL HIGH (ref 65–99)
Potassium: 4.5 mmol/L (ref 3.5–5.1)
Sodium: 137 mmol/L (ref 135–145)

## 2015-11-07 MED ORDER — MORPHINE SULFATE (PF) 2 MG/ML IV SOLN
2.0000 mg | Freq: Once | INTRAVENOUS | Status: AC
  Administered 2015-11-07: 2 mg via INTRAVENOUS
  Filled 2015-11-07: qty 1

## 2015-11-07 NOTE — Progress Notes (Signed)
[facility] PROGRESS NOTE  John Corkran Sr. 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 Will go Monday. stable  HPI/Subjective: No complaints. No reported problems by staff  Objective: Filed Vitals:   11/07/15 0409 11/07/15 1043  BP: 114/57 98/48  Pulse: 91   Temp: 98.2 F (36.8 C)   Resp: 18     Intake/Output Summary (Last 24 hours) at 11/07/15 1636 Last data filed at 11/06/15 2100  Gross per 24 hour  Intake    480 ml  Output      0 ml  Net    480 ml   Filed Weights   11/05/15 0956  Weight: 65.772 kg (145 lb)    Exam:   General:  In bed. Confused. comfortable  Cardiovascular: RRR without MGR  Respiratory: CTA without WRR  Abdomen: S, NT, ND  Ext: dressing CDI  Basic Metabolic Panel:  Recent Labs Lab 11/05/15 1018 11/06/15 0816 11/07/15 0524  NA 139 137 137  K 4.6 4.5 4.5  CL 103 103 101  CO2 23 23 23   GLUCOSE 89 73 118*  BUN 35* 23* 26*  CREATININE 1.26* 1.15 1.25*  CALCIUM 9.7 8.4* 8.3*   Liver Function Tests: No results for input(s): AST, ALT, ALKPHOS, BILITOT, PROT, ALBUMIN in the last 168 hours. No results for input(s): LIPASE, AMYLASE in the last 168 hours. No results for input(s): AMMONIA in the last 168 hours. CBC:  Recent Labs Lab 11/05/15 1018 11/06/15 0816 11/07/15 0524  WBC 6.1 7.3 7.4  HGB 9.2* 8.7* 7.9*  HCT 29.5* 28.5* 25.1*  MCV 88.6 90.2 89.3  PLT 285 268 248   Cardiac Enzymes: No results for input(s):  CKTOTAL, CKMB, CKMBINDEX, TROPONINI in the last 168 hours. BNP (last 3 results) No results for input(s): BNP in the last 8760 hours.  ProBNP (last 3 results) No results for input(s): PROBNP in the last 8760 hours.  CBG: No results for input(s): GLUCAP in the last 168 hours.  Recent Results (from the past 240 hour(s))  Surgical pcr screen     Status: Abnormal   Collection Time: 11/05/15 11:16 AM  Result Value Ref Range Status   MRSA, PCR NEGATIVE NEGATIVE Final   Staphylococcus aureus POSITIVE (A) NEGATIVE Final    Comment:        The Xpert SA Assay (FDA approved for NASAL specimens in patients over 37 years of age), is one component of a comprehensive surveillance program.  Test performance has been validated by [facility] for patients greater than or equal to 76 year old. It is not intended to diagnose infection nor to guide or monitor treatment.      Studies: No results found.  Scheduled Meds: . aspirin  81 mg Oral Daily  . diltiazem  120 mg Oral Daily  . docusate sodium  100 mg Oral Daily  . enoxaparin (LOVENOX) injection  30 mg Subcutaneous Q24H  . isosorbide mononitrate  15 mg Oral Daily  . levothyroxine  50 mcg Oral QAC breakfast  . mirtazapine  15 mg Oral QHS  . pantoprazole  40 mg Oral Daily  . senna  1 tablet Oral BID  . tamsulosin  0.4 mg Oral QPC supper   Continuous Infusions:   Time spent: 25 minutes  [facility] [email or web address], password [facility] 11/07/2015, 4:36 PM  LOS: 2 days

## 2015-11-07 NOTE — Progress Notes (Signed)
    Subjective  - POD #2, s/p R AKA  Did not go to SNF yesterday as family was told it owuld be Monday and they were concerned about weekend staffing at SNF    Physical Exam:  Remains confused Denies pain in stump       Assessment/Plan:  POD #2  To [facility] tomorrow F/u Early 4 weeks  Habiba Treloar, [place] 11/07/2015 10:54 AM --  Filed Vitals:   11/07/15 0409 11/07/15 1043  BP: 114/57 98/48  Pulse: 91   Temp: 98.2 F (36.8 C)   Resp: 18     Intake/Output Summary (Last 24 hours) at 11/07/15 1054 Last data filed at 11/06/15 2100  Gross per 24 hour  Intake    960 ml  Output      0 ml  Net    960 ml     Laboratory CBC    Component Value Date/Time   WBC 7.4 11/07/2015 0524   HGB 7.9* 11/07/2015 0524   HCT 25.1* 11/07/2015 0524   PLT 248 11/07/2015 0524    BMET    Component Value Date/Time   NA 137 11/07/2015 0524   K 4.5 11/07/2015 0524   CL 101 11/07/2015 0524   CO2 23 11/07/2015 0524   GLUCOSE 118* 11/07/2015 0524   BUN 26* 11/07/2015 0524   CREATININE 1.25* 11/07/2015 0524   CALCIUM 8.3* 11/07/2015 0524   GFRNONAA 49* 11/07/2015 0524   GFRAA 57* 11/07/2015 0524    COAG Lab Results  Component Value Date   INR 1.07 11/05/2015   No results found for: PTT  Antibiotics Anti-infectives    Start     Dose/Rate Route Frequency Ordered Stop   11/05/15 2330  cefUROXime (ZINACEF) 1.5 g in dextrose 5 % 50 mL IVPB     1.5 g 100 mL/hr over 30 Minutes Intravenous Every 12 hours 11/05/15 1558 11/06/15 1301   11/05/15 0600  cefUROXime (ZINACEF) 1.5 g in dextrose 5 % 50 mL IVPB     1.5 g 100 mL/hr over 30 Minutes Intravenous To [facility] 11/04/15 1132 11/05/15 1242       V. Leia Alf, M.D. [facility] Office: [phone] Pager:  [phone]

## 2015-11-08 ENCOUNTER — Encounter (HOSPITAL_COMMUNITY): Payer: Self-pay | Admitting: Vascular Surgery

## 2015-11-08 DIAGNOSIS — I739 Peripheral vascular disease, unspecified: Secondary | ICD-10-CM

## 2015-11-08 LAB — CBC
HEMATOCRIT: 27.1 % — AB (ref 39.0–52.0)
Hemoglobin: 8.3 g/dL — ABNORMAL LOW (ref 13.0–17.0)
MCH: 27.5 pg (ref 26.0–34.0)
MCHC: 30.6 g/dL (ref 30.0–36.0)
MCV: 89.7 fL (ref 78.0–100.0)
PLATELETS: 260 10*3/uL (ref 150–400)
RBC: 3.02 MIL/uL — AB (ref 4.22–5.81)
RDW: 13.4 % (ref 11.5–15.5)
WBC: 7.5 10*3/uL (ref 4.0–10.5)

## 2015-11-08 NOTE — Evaluation (Signed)
Occupational Therapy Evaluation Patient Details Name: John Wielenga Sr. MRN: [id number] DOB: Feb 26, 1924 Today's Date: 11/08/2015    History of Present Illness Patient is an 82 yo male admitted 11/05/15 from [place] with non-healing wounds RLE.  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   PMH:  non-healing ulcers RLE, COPD, PVD, HTN, CAD, arthritis   Clinical Impression   Pt is dependent at baseline in ADL, resides in a SNF. Plan is for return to SNF. No acute OT needs. Will defer to SNF's discretion.    Follow Up Recommendations  SNF;Supervision/Assistance - 24 hour    Equipment Recommendations       Recommendations for Other Services       Precautions / Restrictions Precautions Precautions: Fall Restrictions Weight Bearing Restrictions: No      Mobility Bed Mobility   Bed Mobility: Supine to Sit;Sit to Supine     Supine to sit: Min assist Sit to supine: Min assist      Transfers                      Balance     Sitting balance-Leahy Scale: Fair                                      ADL Overall ADL's : At baseline                                             Vision     Perception     Praxis      Pertinent Vitals/Pain Pain Assessment: Faces Faces Pain Scale: No hurt     Hand Dominance Right   Extremity/Trunk Assessment Upper Extremity Assessment Upper Extremity Assessment:  (arthritic changes, limited ROM throughout UEs)   Lower Extremity Assessment LLE Deficits / Details: Flexion contractures in hip and knee.  Able to move LLE against gravity       Communication Communication Communication: No difficulties   Cognition Arousal/Alertness: Awake/alert Behavior During Therapy: Flat affect Overall Cognitive Status: No family/caregiver present to determine baseline cognitive functioning (confused, poor historian, disoriented)                     General Comments       Exercises       Shoulder Instructions      Home Living Family/patient expects to be discharged to:: Skilled nursing facility                                        Prior Functioning/Environment Level of Independence: Needs assistance  Gait / Transfers Assistance Needed: Pt is non ambulatory, unable to report how much assistance or whether he transfers. ADL's / Homemaking Assistance Needed: Assist for all ADL's        OT Diagnosis: Generalized weakness;Cognitive deficits   OT Problem List: Decreased strength;Decreased activity tolerance;Impaired balance (sitting and/or standing);Decreased cognition;Decreased safety awareness;Impaired UE functional use   OT Treatment/Interventions:      OT Goals(Current goals can be found in the care plan section) Acute Rehab OT Goals Patient Stated Goal: return to [facility]  OT Frequency:     Barriers to D/C:  Co-evaluation              End of Session    Activity Tolerance: Patient tolerated treatment well Patient left: in bed;with call bell/phone within reach;with bed alarm set   Time: [id number] OT Time Calculation (min): 14 min Charges:  OT General Charges $OT Visit: 1 Procedure OT Evaluation $OT Eval Moderate Complexity: 1 Procedure G-Codes:    Malka So 11/08/2015, 11:35 AM  [phone]

## 2015-11-08 NOTE — Progress Notes (Signed)
[facility] PROGRESS NOTE  John Schmidt Sr. 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 See discharge summary dated 11/06/15. No changes  HPI/Subjective: No complaints. No reported problems by staff  Objective: Filed Vitals:   11/07/15 2148 11/08/15 0500  BP: 117/65 139/62  Pulse: 78 77  Temp: 97.7 F (36.5 C) 98 F (36.7 C)  Resp: 16 18    Intake/Output Summary (Last 24 hours) at 11/08/15 0923 Last data filed at 11/07/15 2300  Gross per 24 hour  Intake    120 ml  Output      0 ml  Net    120 ml   Filed Weights   11/05/15 0956 11/08/15 0500  Weight: 65.772 kg (145 lb) 61.5 kg (135 lb 9.3 oz)    Exam:   General:  In bed. Comfortable, calm and cooperative, but difficult to understand  Cardiovascular: RRR without MGR  Respiratory: CTA without WRR  Abdomen: S, NT, ND  Ext: dressing CDI  Basic Metabolic Panel:  Recent Labs Lab 11/05/15 1018 11/06/15 0816 11/07/15 0524  NA 139 137 137  K 4.6 4.5 4.5  CL 103 103 101  CO2 23 23 23   GLUCOSE 89 73 118*  BUN 35* 23* 26*  CREATININE 1.26* 1.15 1.25*  CALCIUM 9.7 8.4* 8.3*   Liver Function Tests: No results for input(s): AST, ALT, ALKPHOS, BILITOT, PROT, ALBUMIN in the last 168 hours. No results for input(s): LIPASE, AMYLASE in the last 168 hours. No results for input(s): AMMONIA in the last 168 hours. CBC:  Recent Labs Lab 11/05/15 1018 11/06/15 0816 11/07/15 0524 11/08/15 0310  WBC 6.1  7.3 7.4 7.5  HGB 9.2* 8.7* 7.9* 8.3*  HCT 29.5* 28.5* 25.1* 27.1*  MCV 88.6 90.2 89.3 89.7  PLT 285 268 248 260   Cardiac Enzymes: No results for input(s): CKTOTAL, CKMB, CKMBINDEX, TROPONINI in the last 168 hours. BNP (last 3 results) No results for input(s): BNP in the last 8760 hours.  ProBNP (last 3 results) No results for input(s): PROBNP in the last 8760 hours.  CBG: No results for input(s): GLUCAP in the last 168 hours.  Recent Results (from the past 240 hour(s))  Surgical pcr screen     Status: Abnormal   Collection Time: 11/05/15 11:16 AM  Result Value Ref Range Status   MRSA, PCR NEGATIVE NEGATIVE Final   Staphylococcus aureus POSITIVE (A) NEGATIVE Final    Comment:        The Xpert SA Assay (FDA approved for NASAL specimens in patients over 80 years of age), is one component of a comprehensive surveillance program.  Test performance has been validated by [facility] for patients greater than or equal to 80 year old. It is not intended to diagnose infection nor to guide or monitor treatment.      Studies: No results found.  Scheduled Meds: . aspirin  81 mg Oral Daily  . diltiazem  120 mg Oral Daily  . docusate sodium  100 mg Oral Daily  .  enoxaparin (LOVENOX) injection  30 mg Subcutaneous Q24H  . isosorbide mononitrate  15 mg Oral Daily  . levothyroxine  50 mcg Oral QAC breakfast  . mirtazapine  15 mg Oral QHS  . pantoprazole  40 mg Oral Daily  . senna  1 tablet Oral BID  . tamsulosin  0.4 mg Oral QPC supper   Continuous Infusions:   Time spent: 05 minutes  [facility] [email or web address], password [facility] 11/08/2015, 9:23 AM  LOS: 3 days

## 2015-11-08 NOTE — Progress Notes (Signed)
Patient will discharge to [facility] Anticipated discharge date:3/6 Family notified: Holley Buren (grandson) Transportation by [employer]- scheduled for 1pm  CSW signing off.  Domenica Reamer, [place] Social Worker [phone]

## 2015-11-08 NOTE — Progress Notes (Signed)
  Progress Note    11/08/2015 8:23 AM 3 Days Post-Op  Subjective:  No complaints  Afebrile VSS  Filed Vitals:   11/07/15 2148 11/08/15 0500  BP: 117/65 139/62  Pulse: 78 77  Temp: 97.7 F (36.5 C) 98 F (36.7 C)  Resp: 16 18    Physical Exam: Incisions:  Clean and dry with staples in tact   CBC    Component Value Date/Time   WBC 7.5 11/08/2015 0310   RBC 3.02* 11/08/2015 0310   RBC 4.34 11/05/2015 2044   HGB 8.3* 11/08/2015 0310   HCT 27.1* 11/08/2015 0310   PLT 260 11/08/2015 0310   MCV 89.7 11/08/2015 0310   MCH 27.5 11/08/2015 0310   MCHC 30.6 11/08/2015 0310   RDW 13.4 11/08/2015 0310   LYMPHSABS 0.7 10/14/2014 1248   MONOABS 0.3 10/14/2014 1248   EOSABS 0.1 10/14/2014 1248   BASOSABS 0.0 10/14/2014 1248    BMET    Component Value Date/Time   NA 137 11/07/2015 0524   K 4.5 11/07/2015 0524   CL 101 11/07/2015 0524   CO2 23 11/07/2015 0524   GLUCOSE 118* 11/07/2015 0524   BUN 26* 11/07/2015 0524   CREATININE 1.25* 11/07/2015 0524   CALCIUM 8.3* 11/07/2015 0524   GFRNONAA 49* 11/07/2015 0524   GFRAA 57* 11/07/2015 0524    INR    Component Value Date/Time   INR 1.07 11/05/2015 1018     Intake/Output Summary (Last 24 hours) at 11/08/15 0823 Last data filed at 11/07/15 2300  Gross per 24 hour  Intake    120 ml  Output      0 ml  Net    120 ml     Assessment/Plan:  82 y.o. male is s/p right above knee amputation  3 Days Post-Op  -stump is viable -will order retention sock before he discharges to SNF today. -f/u with Dr. Donnetta Hutching in 4 weeks.   [name], PA-C [facility] [phone] 11/08/2015 8:23 AM

## 2015-11-08 NOTE — Anesthesia Postprocedure Evaluation (Signed)
Anesthesia Post Note  Patient: John Tolhurst Sr.  Procedure(s) Performed: Procedure(s) (LRB): AMPUTATION ABOVE KNEE (Right)  Patient location during evaluation: PACU Anesthesia Type: General Level of consciousness: awake and alert Pain management: pain level controlled Vital Signs Assessment: post-procedure vital signs reviewed and stable Respiratory status: spontaneous breathing, nonlabored ventilation, respiratory function stable and patient connected to nasal cannula oxygen Cardiovascular status: blood pressure returned to baseline and stable Postop Assessment: no signs of nausea or vomiting Anesthetic complications: no                 Zenaida Deed

## 2015-11-08 NOTE — Progress Notes (Signed)
[facility] at bedside to transport patient to [facility].  Patient is alert and oriented X 2.  No distress.  Recently received pain medication for right stump pain.  [employer] has placed retention dressing on stump.  Patient reports that this is more comfortable.  VSS.

## 2015-11-08 NOTE — Care Management Important Message (Signed)
Important Message  Patient Details  Name: John Parady Sr. MRN: [id number] Date of Birth: [birth year]   Medicare Important Message Given:  Yes    Nathen May 11/08/2015, 2:44 PM

## 2015-11-08 NOTE — Progress Notes (Signed)
Orthopedic Tech Progress Note Patient Details:  John Covin Sr. 12-06-1923 [id number]  Patient ID: John Plowman Sr., male   DOB: 1923-12-12, 82 y.o.   MRN: [id number]   [name] 11/08/2015, 10:34 [facility] [employer] for right Retention sock.

## 2015-11-09 ENCOUNTER — Telehealth: Payer: Self-pay | Admitting: Vascular Surgery

## 2015-11-09 ENCOUNTER — Telehealth: Payer: Self-pay | Admitting: *Deleted

## 2015-11-09 ENCOUNTER — Ambulatory Visit: Payer: Medicare Other | Admitting: Urology

## 2015-11-09 NOTE — Telephone Encounter (Signed)
-----   Message from Mena Goes, RN sent at 11/08/2015  9:29 AM EST ----- Regarding: schedule   ----- Message -----    From: Ulyses Amor, PA-C    Sent: 11/06/2015   8:02 AM      To: [facility] Charge Pool  F/U with Dr. Donnetta Hutching in 4 weeks s/p Right AKA

## 2015-11-09 NOTE — Telephone Encounter (Signed)
Spoke to [employer], [place] [phone]); She wanted to confirm that the patient's Right AKA staples were to stay in x 4 weeks. I verified this with her, pt's postop visit is on 12-07-15.  Patient is a resident at [facility] in [place], She will make sure all discharge orders regarding wound care, PT and OT are fulfilled.

## 2015-11-09 NOTE — Telephone Encounter (Signed)
Spoke with Tammy to schedule, dpm

## 2015-11-11 ENCOUNTER — Encounter: Payer: Medicare Other | Admitting: Vascular Surgery

## 2015-11-26 ENCOUNTER — Encounter: Payer: Self-pay | Admitting: Vascular Surgery

## 2015-11-28 ENCOUNTER — Encounter (HOSPITAL_COMMUNITY): Payer: Self-pay | Admitting: Vascular Surgery

## 2015-11-28 NOTE — Addendum Note (Signed)
Addendum  created 11/28/15 0140 by Jillyn Hidden, MD   Modules edited: Anesthesia Events, Notes Section   Notes Section:  File: [id number]

## 2015-11-30 ENCOUNTER — Ambulatory Visit: Payer: Medicare Other | Admitting: Orthopaedic Surgery

## 2015-12-07 ENCOUNTER — Ambulatory Visit (INDEPENDENT_AMBULATORY_CARE_PROVIDER_SITE_OTHER): Payer: Medicare Other | Admitting: Vascular Surgery

## 2015-12-07 ENCOUNTER — Encounter: Payer: Self-pay | Admitting: Vascular Surgery

## 2015-12-07 VITALS — BP 78/46 | HR 75 | Temp 97.7°F | Ht 64.0 in | Wt 135.0 lb

## 2015-12-07 DIAGNOSIS — I998 Other disorder of circulatory system: Secondary | ICD-10-CM

## 2015-12-07 DIAGNOSIS — I70229 Atherosclerosis of native arteries of extremities with rest pain, unspecified extremity: Secondary | ICD-10-CM

## 2015-12-07 NOTE — Progress Notes (Signed)
Patient name: John Mikes Sr. MRN: [id number] DOB: 04-30-1924 Sex: male  REASON FOR VISIT: Patient's here today status post right above-knee amputation  HPI: John Mccumbers Sr. is a 82 y.o. male had presented with a non-reconstructable critical limb ischemia. The patient does not ambulate and is in a nursing facility. He denies any pain in his right amputation site is here today for staple removal  Current Outpatient Prescriptions  Medication Sig Dispense Refill  . acetaminophen (TYLENOL) 500 MG tablet Take 1,000 mg by mouth every 6 (six) hours as needed for moderate pain or headache.    . albuterol (PROVENTIL HFA;VENTOLIN HFA) 108 (90 BASE) MCG/ACT inhaler Inhale 2 puffs into the lungs every 6 (six) hours as needed for wheezing.    Marland Kitchen aspirin 81 MG chewable tablet Chew 1 tablet (81 mg total) by mouth daily. 30 tablet 1  . diltiazem (TIAZAC) 120 MG 24 hr capsule Take 120 mg by mouth daily.    Marland Kitchen docusate sodium (COLACE) 100 MG capsule Take 1 capsule (100 mg total) by mouth daily. 10 capsule 0  . guaiFENesin-dextromethorphan (ROBITUSSIN DM) 100-10 MG/5ML syrup Take 10 mLs by mouth every 4 (four) hours as needed for cough.    Marland Kitchen HYDROcodone-acetaminophen (NORCO) 10-325 MG tablet Take 1 tablet by mouth every 6 (six) hours as needed for severe pain. 20 tablet 0  . isosorbide mononitrate (IMDUR) 30 MG 24 hr tablet Take 15 mg by mouth daily.     Marland Kitchen levothyroxine (SYNTHROID, LEVOTHROID) 50 MCG tablet Take 50 mcg by mouth daily before breakfast.    . loratadine (CLARITIN) 10 MG tablet Take 10 mg by mouth daily.    . magnesium hydroxide (MILK OF MAGNESIA) 400 MG/5ML suspension Take 30 mLs by mouth daily as needed for mild constipation.    . methotrexate 25 MG/ML SOLN Take 15 mg by mouth once a week.    . mirtazapine (REMERON) 15 MG tablet Take 15 mg by mouth at bedtime.    . nitroGLYCERIN (NITROSTAT) 0.4 MG SL tablet Place 0.4 mg under the tongue every 5 (five) minutes as needed for chest pain.    .  pantoprazole (PROTONIX) 20 MG tablet Take 20 mg by mouth daily.    Vladimir Faster Glycol-Propyl Glycol (SYSTANE) 0.4-0.3 % SOLN Apply 1 drop to eye every 2 (two) hours as needed (dry eyes).    . promethazine (PHENERGAN) 25 MG tablet Take 25 mg by mouth every 4 (four) hours as needed for nausea or vomiting.    . senna (SENOKOT) 8.6 MG TABS tablet Take 1 tablet (8.6 mg total) by mouth daily. 120 each 0  . tamsulosin (FLOMAX) 0.4 MG CAPS capsule Take 0.4 mg by mouth daily after supper.     No current facility-administered medications for this visit.       PHYSICAL EXAM: Filed Vitals:   12/07/15 1040  BP: 78/46  Pulse: 75  Temp: 97.7 F (36.5 C)  TempSrc: Oral  Height: 5\' 4"  (1.626 m)  Weight: 135 lb (61.236 kg)  SpO2: 100%    GENERAL: The patient is a well-nourished male, in no acute distress. The vital signs are documented above.  Right above-knee amputation is well-healed. Staple line is intact. Left foot well-perfused. No evidence of heel ulceration. Does have a very superficial callus over his second toe which does not appear to be involved with ischemia  MEDICAL ISSUES: Stable overall. We will let remove staples today. I will see [place] again on an as-needed basis  Lillia Lengel [facility] [place]: [phone]

## 2016-06-04 DEATH — deceased

## 2016-11-08 IMAGING — CT CT ABD-PELV W/ CM
2 of 5 series · 16 of 46 positions shown, 18 images · IV contrast (Omnipaque 300)
Comparison: 08/19/2012

CLINICAL DATA: Malignant neoplasm of the prostate gland.

EXAM:
CT ABDOMEN AND PELVIS WITH CONTRAST
TECHNIQUE: Multidetector CT imaging of the abdomen and pelvis was performed
using the standard protocol following bolus administration of
intravenous contrast.
CONTRAST:  80mL OMNIPAQUE IOHEXOL 300 MG/ML  SOLN

[Series 2: abd_pel_with 5.0 b40f · axial · 0.62mm/px · z∈[-443,-63]mm · 13 of 90 slices shown, 15 images]
[im 7/90  soft-tissue]
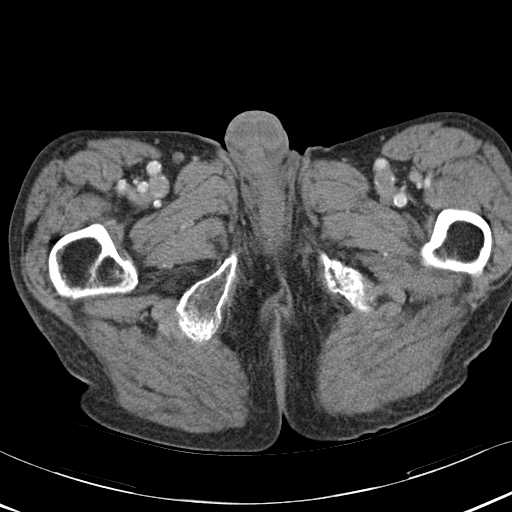
[im 7/90  bone]
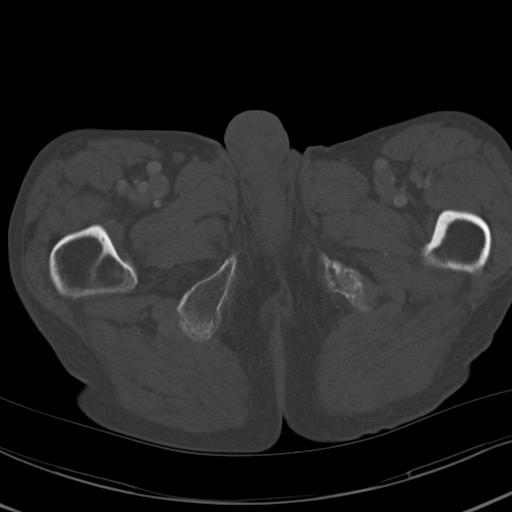
[im 13/90  soft-tissue]
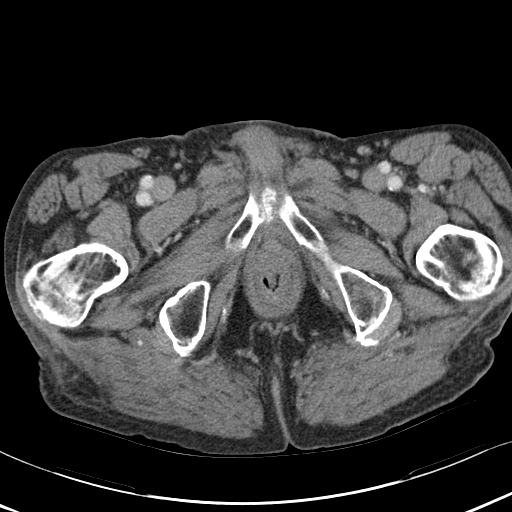
[im 20/90  soft-tissue]
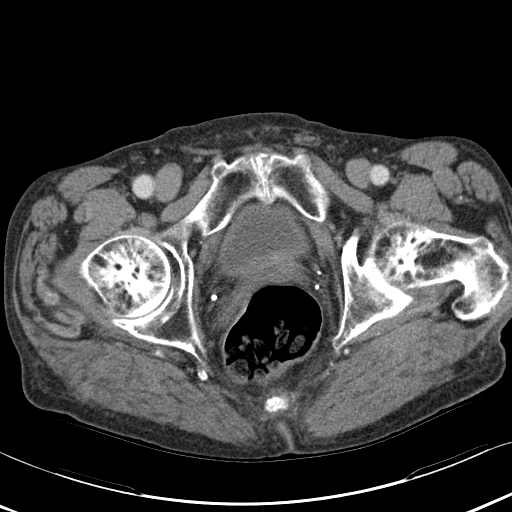
[im 26/90  soft-tissue]
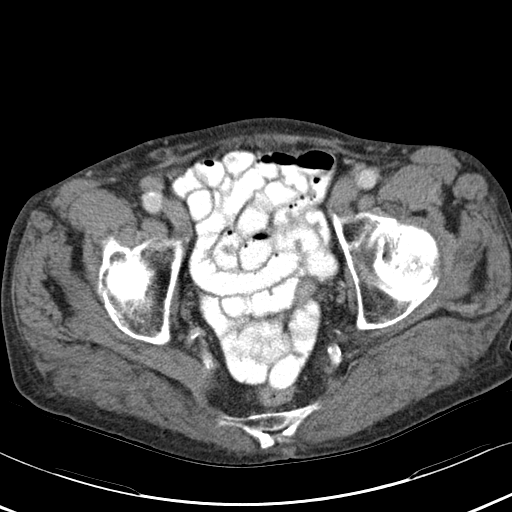
[im 32/90  soft-tissue]
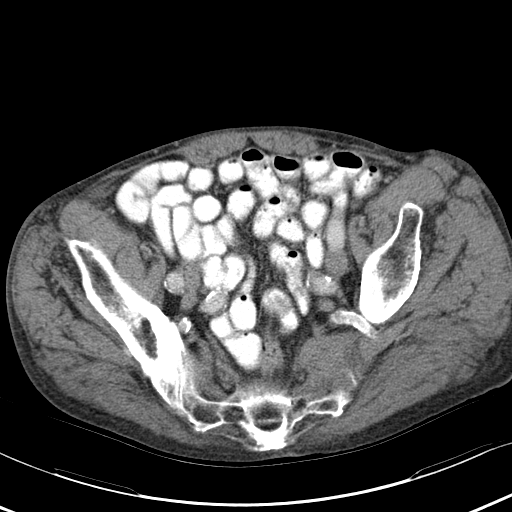
[im 39/90  soft-tissue]
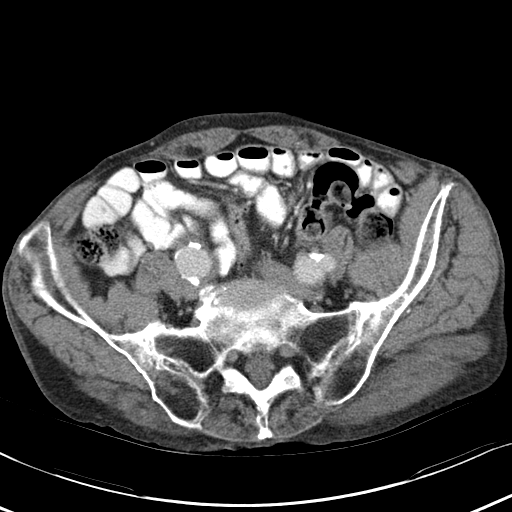
[im 45/90  soft-tissue]
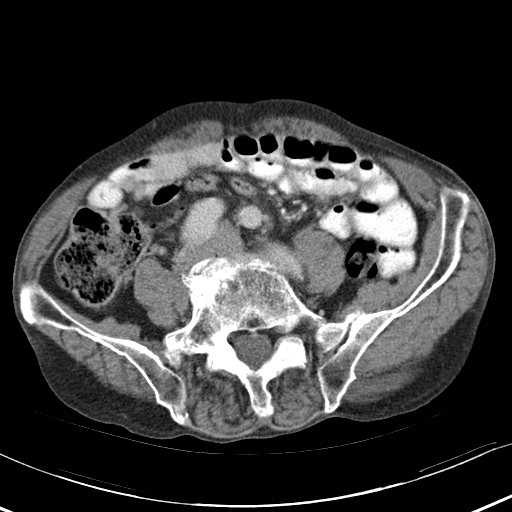
[im 51/90  soft-tissue]
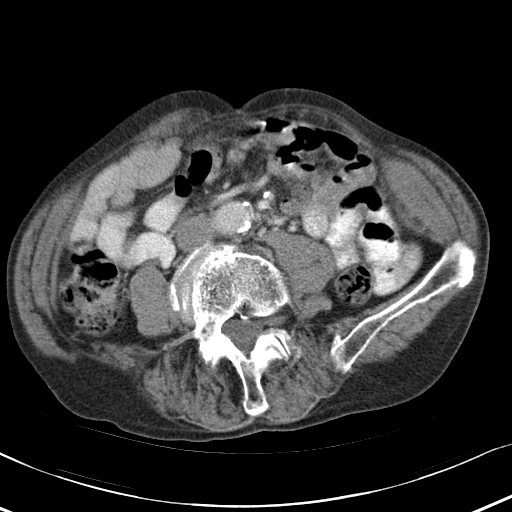
[im 58/90  soft-tissue]
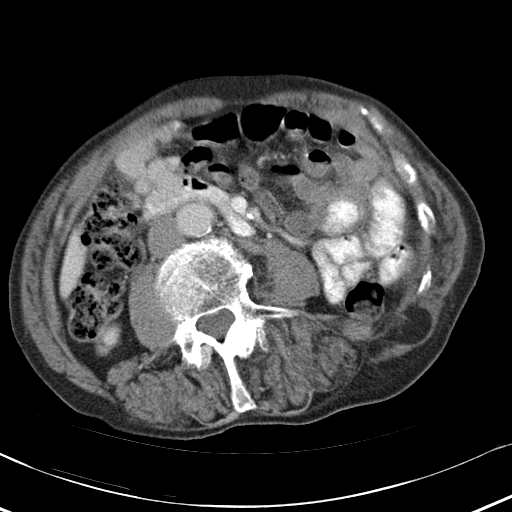
[im 58/90  bone]
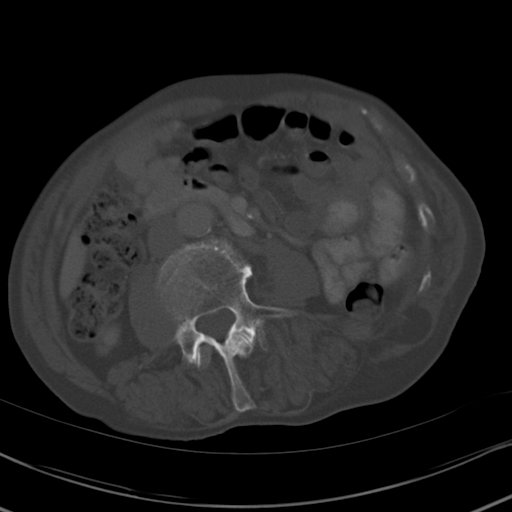
[im 64/90  soft-tissue]
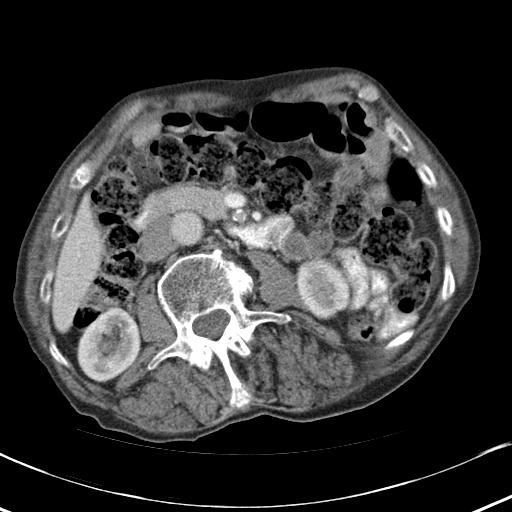
[im 70/90  soft-tissue]
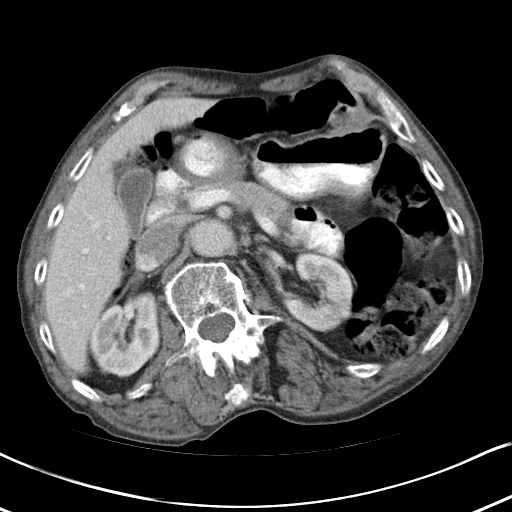
[im 77/90  soft-tissue]
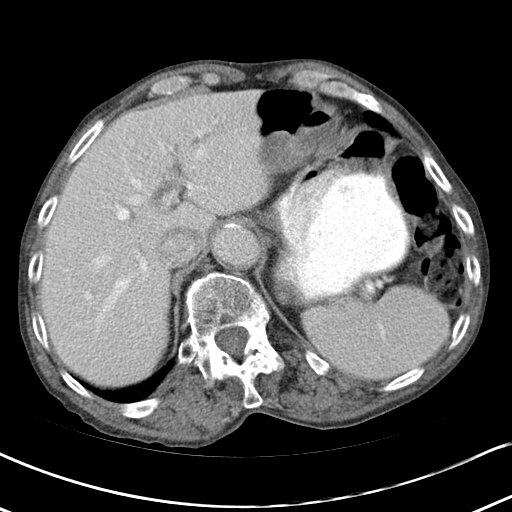
[im 83/90  soft-tissue]
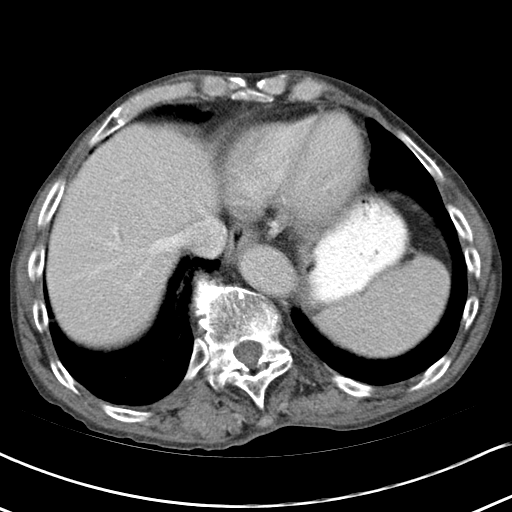

[Series 5: abd_pel_with 3.0 spo cor · coronal · 0.68mm/px · 3 of 82 slices shown]
[im 28/82  soft-tissue]
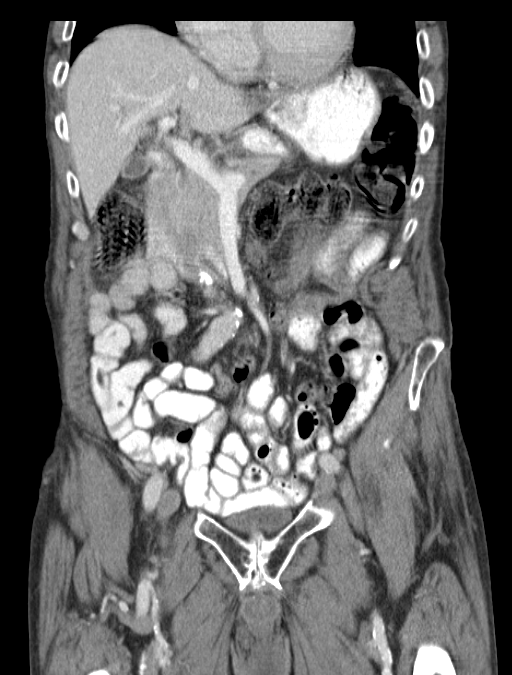
[im 37/82  soft-tissue]
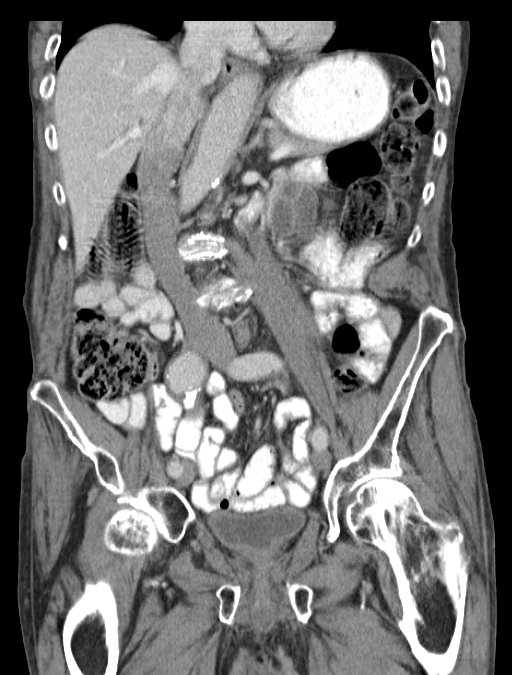
[im 46/82  soft-tissue]
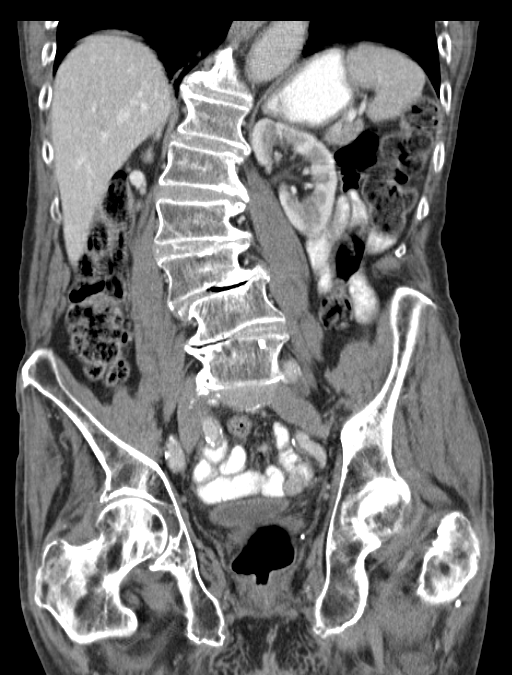

[16 of 46 positions shown; findings below may reference images not displayed]

FINDINGS: Lower chest: There is no pleural effusion identified. The lung bases
are clear.

Hepatobiliary: Stable nonspecific low-attenuation structure within
the posterior right hepatic lobe measuring 1 cm, image number
16/series 2. No significant biliary dilatation. The gallbladder
appears normal

Pancreas: The pancreas is on unremarkable.

Spleen: Normal appearance of the spleen

Adrenals/Urinary Tract: The adrenal glands are both normal.

Stomach/Bowel: Normal appearance of the stomach. The small bowel
loops have a normal course and caliber without obstruction. The
appendix is visualized and appears normal. Unremarkable appearance
of the colon.

Vascular/Lymphatic: Calcified atherosclerotic disease involves the
abdominal aorta. The aorta measures up to 2 cm in maximum AP
dimension. The right common iliac artery measures 2.7 cm. The left
common iliac artery measures 2.2 cm. No enlarged retroperitoneal or
mesenteric adenopathy. No enlarged pelvic or inguinal lymph nodes.

Reproductive: Prostate gland and seminal vesicles are unremarkable.

Other: There is no ascites or focal fluid collections within the
abdomen or pelvis.

Musculoskeletal: Review of the visualized osseous structures is
significant for scoliosis which is convex towards the right. There
is multi level lumbar degenerative disc disease. No aggressive lytic
or sclerotic bone lesions identified.
IMPRESSION: 1. No specific features identified to suggest prostate cancer
metastasis to the abdomen or pelvis.
2. Atherosclerotic disease. There is aneurysmal dilatation of
bilateral common iliac arteries. The right common iliac artery
measures up to 2.7 cm.
